# Patient Record
Sex: Male | Born: 1935 | Race: Black or African American | Hispanic: No | Smoking: Never smoker
Health system: Southern US, Community
[De-identification: ages and names within clinical notes are randomized; demographics above are authoritative.]

## PROBLEM LIST (undated history)

## (undated) DIAGNOSIS — M1990 Unspecified osteoarthritis, unspecified site: Secondary | ICD-10-CM

## (undated) HISTORY — PX: EYE SURGERY: SHX253

## (undated) HISTORY — PX: BRAIN SURGERY: SHX531

---

## 1998-10-15 ENCOUNTER — Emergency Department (HOSPITAL_COMMUNITY): Admission: EM | Admit: 1998-10-15 | Discharge: 1998-10-15 | Payer: Self-pay | Admitting: *Deleted

## 1998-10-15 ENCOUNTER — Encounter: Payer: Self-pay | Admitting: Emergency Medicine

## 1998-11-10 ENCOUNTER — Emergency Department (HOSPITAL_COMMUNITY): Admission: EM | Admit: 1998-11-10 | Discharge: 1998-11-10 | Payer: Self-pay | Admitting: Internal Medicine

## 1999-03-02 ENCOUNTER — Ambulatory Visit (HOSPITAL_BASED_OUTPATIENT_CLINIC_OR_DEPARTMENT_OTHER): Admission: RE | Admit: 1999-03-02 | Discharge: 1999-03-02 | Payer: Self-pay | Admitting: Urology

## 1999-12-25 ENCOUNTER — Encounter (INDEPENDENT_AMBULATORY_CARE_PROVIDER_SITE_OTHER): Payer: Self-pay | Admitting: *Deleted

## 1999-12-25 ENCOUNTER — Ambulatory Visit (HOSPITAL_COMMUNITY): Admission: RE | Admit: 1999-12-25 | Discharge: 1999-12-25 | Payer: Self-pay | Admitting: Gastroenterology

## 2000-03-28 ENCOUNTER — Other Ambulatory Visit: Admission: RE | Admit: 2000-03-28 | Discharge: 2000-03-28 | Payer: Self-pay | Admitting: Orthopedic Surgery

## 2002-11-01 ENCOUNTER — Emergency Department (HOSPITAL_COMMUNITY): Admission: EM | Admit: 2002-11-01 | Discharge: 2002-11-01 | Payer: Self-pay | Admitting: Emergency Medicine

## 2002-11-01 ENCOUNTER — Encounter: Payer: Self-pay | Admitting: Emergency Medicine

## 2003-09-22 ENCOUNTER — Encounter: Admission: RE | Admit: 2003-09-22 | Discharge: 2003-10-21 | Payer: Self-pay | Admitting: Family Medicine

## 2003-10-29 ENCOUNTER — Encounter: Admission: RE | Admit: 2003-10-29 | Discharge: 2003-11-19 | Payer: Self-pay | Admitting: Family Medicine

## 2004-01-31 ENCOUNTER — Emergency Department (HOSPITAL_COMMUNITY): Admission: EM | Admit: 2004-01-31 | Discharge: 2004-01-31 | Payer: Self-pay | Admitting: Emergency Medicine

## 2004-11-21 ENCOUNTER — Emergency Department (HOSPITAL_COMMUNITY): Admission: EM | Admit: 2004-11-21 | Discharge: 2004-11-21 | Payer: Self-pay | Admitting: Emergency Medicine

## 2005-05-02 ENCOUNTER — Encounter (INDEPENDENT_AMBULATORY_CARE_PROVIDER_SITE_OTHER): Payer: Self-pay | Admitting: Specialist

## 2005-05-02 ENCOUNTER — Inpatient Hospital Stay (HOSPITAL_COMMUNITY): Admission: RE | Admit: 2005-05-02 | Discharge: 2005-05-05 | Payer: Self-pay | Admitting: Urology

## 2008-01-12 ENCOUNTER — Emergency Department (HOSPITAL_COMMUNITY): Admission: EM | Admit: 2008-01-12 | Discharge: 2008-01-12 | Payer: Self-pay | Admitting: Emergency Medicine

## 2009-02-04 ENCOUNTER — Encounter: Admission: RE | Admit: 2009-02-04 | Discharge: 2009-04-12 | Payer: Self-pay | Admitting: Family Medicine

## 2009-07-04 ENCOUNTER — Encounter: Admission: RE | Admit: 2009-07-04 | Discharge: 2009-07-04 | Payer: Self-pay | Admitting: Family Medicine

## 2009-07-14 ENCOUNTER — Encounter: Admission: RE | Admit: 2009-07-14 | Discharge: 2009-07-14 | Payer: Self-pay | Admitting: Neurosurgery

## 2010-10-13 NOTE — Op Note (Signed)
Leonard Wilcox, Leonard Wilcox             ACCOUNT NO.:  [id number]   MEDICAL RECORD NO.:  [id number]          PATIENT TYPE:  INP   LOCATION:  1432                         FACILITY:  [facility]   PHYSICIAN:  Lindaann Slough, M.D.  DATE OF BIRTH:  June 29, 1930   DATE OF PROCEDURE:  05/03/2005  DATE OF DISCHARGE:                                 OPERATIVE REPORT   PREOPERATIVE DIAGNOSIS:  Benign prostatic hypertrophy.   POSTOPERATIVE DIAGNOSIS:  Benign prostatic hypertrophy.   PROCEDURE:  Simple retropubic prostatectomy.   SURGEON:  Dr. Brunilda Payor   ASSISTANT:  Dr. Blanch Media   ANESTHESIA:  General endotracheal.   SPECIMENS:  Prostatic adenoma.   DRAINS:  10-French fully-fluted Jackson-Pratt drain in the space of Retzius.   PROCEDURE:  The patient was identified by his wrist bracelet and brought to  room 6, where he received preprocedure antibiotics and was administered  general anesthesia.  He was prepped and draped in the usual sterile fashion,  taking great care to minimize risk of peripheral neuropathy and compartment  syndrome.  Next, we placed a 20-French irrigating catheter into his urinary  bladder.  Excellent clear urine output was obtained.  Next, we made a 10 cm  lower midline incision from his pubic symphysis to several centimeters below  his umbilicus.  We carried down through the dermis, subcutaneous fat, and  Scarpa's fascia to the level of the anterior fascia with Bovie cautery and  maintained excellent hemostasis as we progressed.  The fascial sheath was  then opened with Bovie cautery along its length.  We were able to identify  and bluntly separate the medial aspect of the rectus from Morison  pyramidalis muscle bellies.  We then bluntly developed the space of Retzius.  Next, a Bookwalter retractor was set, exposing the bladder and the anterior  surface of the prostate.  With Bovie cautery, we dissected some overlying  fat off the anterior surface of the prostate.  We then placed  stay sutures  on the anterior surface of the prostate at the base and towards the apex  along the midline of the prostate.  We then made a transverse incision with  the Bovie cautery along the anterior surface of the prostate down through  the prostatic capsule.  Next, using the surgeon's finger, we enucleated the  prostatic adenoma in a circumferential manner from the base to the apex of  the prostate gland.  When the entire adenoma was freed, we split it  posteriorly and removed it from the prostatic capsule.  We then inspected  the prostatic fossa, and there were 2 small remaining areas of residual  adenoma which were removed sharply.  Next, we addressed hemostasis.  The  bladder neck was thoroughly inspected.  There was 1 small area of bleeding  at the 5 o'clock position which was addressed with Bovie cautery.  There was  no other bleeding noted in the prostatic fossa. Next, we irrigated the  bladder through the prostatic fossa with Toomey syringe.  Several clots were  removed.  Following this, we proceeded to close the prostatic capsule,  taking full-thickness bites with a  running 0 Vicryl.  Following this, we  made a 0.25 cm incision in the right lower quadrant and placed a tonsil  through the fascia into our operative bed.  A 10-French fully-fluted Blake  drain was brought through on a pass, __________ position.  We had checked  that it would be able to be easily removed through our incision site which  it was.  We next secured it into place with a 4-0 silk suture and placed the  end of the drain over the prostatic suture line.  Following this, we  proceeded to close our fascia with a running 0 PDS.  Wound was irrigated.  We then closed the skin with staples.  Dressings were applied.  Foley  catheter was attached to drainage bag, and the patient was reversed from his  anesthesia without complication.  Please note Dr. Su Grand was present and  participated in all aspects of this  case.     ______________________________  Glade Nurse, MD      Lindaann Slough, M.D.  Electronically Signed    MT/MEDQ  D:  05/03/2005  T:  05/03/2005  Job:  [id number]

## 2010-10-13 NOTE — Procedures (Signed)
[place]  Patient:    Leonard Wilcox, Leonard Wilcox                    MRN: [id number] Proc. Date: 12/25/99 Adm. Date:  [id number] Attending:  Rich Brave CC:         Charles A. Tenny Craw, M.D., [facility]                           Procedure Report  PROCEDURE:  Colonoscopy with biopsies.  INDICATIONS:  A 77 year old with previous history of adenomatous polyp, with most recent colonoscopy five years ago having been negative.  FINDINGS:  Diminutive sessile polyp, biopsied.  Otherwise normal.  PROCEDURE:  The nature, purpose, and risks of the procedure were familiar to the patient from prior examinations and he provided written consent.  Sedation was fentanyl 50 mcg and Versed 5 mg IV without arrhythmias or desaturation. Digital examination:  the prostate was unremarkable.  We used the Olympus pediatric adjustable-tension video colonoscope, which was advanced quite easily to the cecum, applying some external abdominal compression to reach the base of the cecum.  The cecum was identified by visualization of the appendiceal orifice and the absence of further lumen.  Pullback was then performed.  The quality of the prep was excellent and it was felt that all areas were adequately seen.  There was a 3 mm sessile or semipedunculated polyp in the mid colon removed by a single cold biopsy.  There was also what appeared to be a transiently-seen diminutive sessile polyp in the proximal ascending colon, but careful examination of the area failed to show this to be a persistent lesion.  No large polyps, cancer, colitis, vascular malformations or diverticular disease were observed and retroflexion in the rectum was unremarkable.  The patient tolerated the procedure well and there were no apparent complications.  IMPRESSION:  Normal examination except for diminutive polyp in mid colon, pathology pending.  PLAN:  Await pathology.  I  anticipate doing colonoscopic surveillance in five years, regardless of the polyp histology, in view of the prior history of a colonic adenoma having been removed. DD:  12/25/98 TD:  12/25/99 Job: [id number]

## 2010-10-13 NOTE — Discharge Summary (Signed)
Leonard Wilcox, Leonard Wilcox             ACCOUNT NO.:  [id number]   MEDICAL RECORD NO.:  [id number]          PATIENT TYPE:  INP   LOCATION:  1432                         FACILITY:  [facility]   PHYSICIAN:  Lindaann Slough, M.D.  DATE OF BIRTH:  1930-12-31   DATE OF ADMISSION:  05/02/2005  DATE OF DISCHARGE:  05/05/2005                                 DISCHARGE SUMMARY   DISCHARGE DIAGNOSES:  Benign prostatic hypertrophy.   PROCEDURE:  Simple retropubic prostatectomy.   SURGEON:  Danae Chen, M.D.   CONSULTS:  None.   HOSPITAL COURSE:  Patient was admitted to the operating room on May 02, 2005 and underwent the above-named procedure, which he has tolerated without  complication.  He was taken to the PACU and to the floor in stable  condition, where he remained throughout his hospital stay, which consisted  of progressive toleration of ambulation, diet, and pain.   On postoperative day #2, his Jackson-Pratt output had slowed to less than 10  cc a shift, and his drain was removed without difficulty.  He was then  discharged, and the patient was in stable condition to be discharged home.   DISCHARGE PHYSICAL EXAMINATION:  ABDOMEN:  Soft, nondistended, nontender to  palpation.  Staple line is clean, dry, and intact without surrounding  erythema or exudate.  Foley catheter is in place draining clear urine.   DISCHARGE INSTRUCTIONS:  Patient is given routine wound care instructions.  Directed to call or return if he begins to experience any fevers, chills,  redness, or drainage from his wound.  He is instructed not to lift more than  10 pounds over the next six weeks.  He is given routine Foley care  instructions.  He is to call or return if he begins to experience leakage of  urine around his catheter or decreased output from his catheter.  He  understands and agrees with these instructions.  He will be scheduled to  follow up with Dr. Brunilda Payor in approximately one week for wound check as well  as  for removal of Foley catheter.   DISCHARGE MEDICATIONS:  1.  Vicodin.  2.  Colace.  3.  Ditropan 5 mg q.8h. p.r.n.  4.  Resume previous meds.     ______________________________  Glade Nurse, MD      Lindaann Slough, M.D.  Electronically Signed    MT/MEDQ  D:  05/07/2005  T:  05/07/2005  Job:  [id number]

## 2010-10-13 NOTE — H&P (Signed)
Leonard Wilcox, Leonard Wilcox             ACCOUNT NO.:  [id number]   MEDICAL RECORD NO.:  [id number]          PATIENT TYPE:  INP   LOCATION:  0007                         FACILITY:  [facility]   PHYSICIAN:  Lindaann Slough, M.D.  DATE OF BIRTH:  10/07/1930   DATE OF ADMISSION:  05/02/2005  DATE OF DISCHARGE:                                HISTORY & PHYSICAL   CHIEF COMPLAINT:  1.  Frequency, hesitancy, and decreased force of urinary stream.  2.  Incomplete emptying of the bladder.   HISTORY OF PRESENT ILLNESS:  The patient is a 77 year old male who has a  long history of frequency, hesitancy, decreased force of urinary stream, and  a sensation of incomplete emptying of the bladder.  He has been on Flomax  two capsules at night.  He continues to have the same symptoms.  He has a  history of an elevated PSA and had a negative prostate biopsy in August  2006.  A prostate ultrasound shows a volume of 149 g.  Treatment options  were discussed with the patient.  I told him that a TURP could require more  than one session and it would be preferable to do an open prostatectomy.  The procedure, the risks, and the benefits have been discussed with the  patient and his wife.  He is admitted today for the procedure.   PAST MEDICAL HISTORY:  Positive for hypertension.   MEDICATIONS:  1.  Flomax two capsules at night.  2.  Altace 10 mg daily.  3.  Hydrochlorothiazide 12.5 mg a day.   ALLERGIES:  SPORANOX.   PAST SURGICAL HISTORY:  None.   SOCIAL HISTORY:  He is married and has one son.  He does not smoke and  drinks one glass of wine every night.   FAMILY HISTORY:  Positive for hypertension.  He has two brothers.  His  father died in his 71s and his mother died in her 60s.   REVIEW OF SYSTEMS:  He complains of fever and chills at times.  Frequency,  slow stream, and voiding a small amount of urine at a time.  He has  straining on urination, nocturia times one to two.  All others are negative.   PHYSICAL EXAMINATION:  GENERAL:  This is a well-built 77 year old male in no  acute distress.  He is oriented to time, place, and person.  VITAL SIGNS:  Blood pressure is 138/88, pulse 62, respirations 14,  temperature 97.  HEENT:  His head is normal.  Pupils are equal and reactive to light and  accommodation.  Ears, nose, and throat within normal limits.  He has pink  conjunctivae.  NECK:  He has no cervical adenopathy.  Supple.  No thyromegaly.  LUNGS:  Clear to percussion and auscultation.  HEART:  Regular rhythm.  ABDOMEN:  Soft, non-distended, non-tender.  He has no CVA tenderness.  The  kidneys are not palpable.  He has no hepatomegaly or  splenomegaly.  The  bladder is not distended.  He has no umbilical or inguinal hernia.  No  inguinal adenopathy.  GENITALIA:  The penis is circumcised and  the meatus is normal.  Scrotum is  normal.  The tail of the right epididymis is firm and non-tender.  The left  testes is normal.  There is no testicular mass.  The left epididymis is  normal.  Both cords are normal.  Anal and perineal skin is normal.  RECTAL:  Sphincter tone is normal.  The prostate is enlarged: 90 g.  no  nodules.  The seminal vesicles are not palpable.   IMPRESSION:  1.  Bladder outlet obstruction.  2.  Benign prostatic hypertrophy.  3.  Hypertension.      Lindaann Slough, M.D.  Electronically Signed     MN/MEDQ  D:  05/02/2005  T:  05/02/2005  Job:  [id number]

## 2011-06-21 DIAGNOSIS — IMO0002 Reserved for concepts with insufficient information to code with codable children: Secondary | ICD-10-CM | POA: Insufficient documentation

## 2011-06-21 DIAGNOSIS — H35372 Puckering of macula, left eye: Secondary | ICD-10-CM | POA: Insufficient documentation

## 2011-06-21 DIAGNOSIS — H33022 Retinal detachment with multiple breaks, left eye: Secondary | ICD-10-CM | POA: Insufficient documentation

## 2011-06-21 DIAGNOSIS — H33311 Horseshoe tear of retina without detachment, right eye: Secondary | ICD-10-CM | POA: Insufficient documentation

## 2011-06-21 DIAGNOSIS — Z961 Presence of intraocular lens: Secondary | ICD-10-CM | POA: Insufficient documentation

## 2013-02-16 ENCOUNTER — Other Ambulatory Visit: Payer: Self-pay | Admitting: Gastroenterology

## 2013-06-25 DIAGNOSIS — Z961 Presence of intraocular lens: Secondary | ICD-10-CM | POA: Insufficient documentation

## 2015-03-28 ENCOUNTER — Encounter: Payer: Self-pay | Admitting: Podiatry

## 2015-03-28 ENCOUNTER — Ambulatory Visit (INDEPENDENT_AMBULATORY_CARE_PROVIDER_SITE_OTHER): Payer: Medicare Other | Admitting: Podiatry

## 2015-03-28 VITALS — BP 140/97 | HR 75 | Resp 16 | Ht 74.0 in | Wt 156.0 lb

## 2015-03-28 DIAGNOSIS — L600 Ingrowing nail: Secondary | ICD-10-CM

## 2015-03-28 NOTE — Patient Instructions (Signed)

## 2015-03-28 NOTE — Progress Notes (Signed)
   Subjective:    Patient ID: Leonard Wilcox, male    DOB: Sep 29, 1930, 81 y.o.   MRN: [id number]  HPI Patient presents with a nail problem in their right foot, great toe-medial side. Pt stated, "nail was removed around 2011 and now nail is growing back".  Pt saw Dr. Salley Scarlet regarding toe. Dr. Rockey Situ pt looked infected because pus was coming out.   Review of Systems  HENT: Positive for hearing loss.   Eyes: Positive for visual disturbance.  Skin: Positive for color change.  All other systems reviewed and are negative.      Objective:   Physical Exam        Assessment & Plan:

## 2015-03-29 ENCOUNTER — Telehealth: Payer: Self-pay | Admitting: *Deleted

## 2015-03-29 NOTE — Telephone Encounter (Signed)
Left message for patient at [phone] (Home #) to check to see how they were doing from their ingrown toenail procedure that was performed on Monday, March 28, 2015. Waiting for a response.

## 2015-03-30 ENCOUNTER — Telehealth: Payer: Self-pay | Admitting: *Deleted

## 2015-03-30 NOTE — Telephone Encounter (Signed)
Pt states he had an ingrown toenail procedure 03/28/2015 and misread the soaking instructions and soaked the foot the day of the surgery rather than the next day.  Pt asked if that was okay.  Left message informing pt that there should be no decrease in the effectiveness of the medication applied to the surgical site.

## 2015-03-30 NOTE — Progress Notes (Signed)
Subjective:     Patient ID: DENSON NICCOLI, male   DOB: 1930-11-12, 81 y.o.   MRN: [id number]  HPI patient presents with a nail spicule right big toe that can become bothersome and makes it hard to take off his socks. He had had previous surgery done several years ago   Review of Systems  All other systems reviewed and are negative.      Objective:   Physical Exam  Constitutional: He is oriented to person, place, and time.  Cardiovascular: Intact distal pulses.   Musculoskeletal: Normal range of motion.  Neurological: He is oriented to person, place, and time.  Skin: Skin is warm.  Nursing note and vitals reviewed.  neurovascular status found to be intact muscle strength adequate range of motion within normal limits with patient noted to have a ingrown toenail right hallux medial side that's painful when pressed and can make shoe gear difficult. Patient's tried to trim it himself without relief of symptoms and is found to have good digital perfusion and is well oriented 3     Assessment:      ingrown toenail deformity right hallux medial border    Plan:      H&P condition reviewed with patient. I have recommended removal of the nail corner and explained risk and he wants surgery and today I infiltrated 60 mg Xylocaine Marcaine mixture and remove the corner and applied phenol 3 applications 30 seconds followed by alcohol lavage and sterile dressing. They've instructions on soaks and reappoint

## 2015-03-30 NOTE — Telephone Encounter (Signed)
Pt states he received my first call answering his soaking question, but would like to know how long he needed to keep the bandaid on the toe.  Left message informing pt he would need to keep the area covered with an antibiotic bandaid after each soak, and the soaking would need to continue for 2-3 weeks for a spicule removal, that once the area had a hard dry scab without drainage, redness or swelling he could stop the soaks and dressings.

## 2015-04-04 ENCOUNTER — Other Ambulatory Visit: Payer: Self-pay | Admitting: Family Medicine

## 2015-04-04 ENCOUNTER — Ambulatory Visit
Admission: RE | Admit: 2015-04-04 | Discharge: 2015-04-04 | Disposition: A | Discharge status: Home / Self Care | Payer: Medicare Other | Source: Ambulatory Visit | Attending: Family Medicine | Admitting: Family Medicine

## 2015-04-04 ENCOUNTER — Ambulatory Visit: Payer: Self-pay | Admitting: Podiatry

## 2015-04-04 DIAGNOSIS — R079 Chest pain, unspecified: Secondary | ICD-10-CM

## 2015-04-13 ENCOUNTER — Telehealth: Payer: Self-pay | Admitting: *Deleted

## 2015-04-13 NOTE — Telephone Encounter (Signed)
Pt states he is now soaking the toe that had the spicule procedure once daily and covering with Neosporin, is that okay.  Left message encouraging pt to continue daily soaks and neosporin dressings if he will be up and around or in enclosed shoes, but if resting or going to bed may allow the area to air dry, this will help him get to the point where the area has a hard dry scab, without drainage, and then he can stop soaks and dressings.

## 2015-05-05 ENCOUNTER — Encounter: Payer: Self-pay | Admitting: Podiatry

## 2015-05-05 ENCOUNTER — Ambulatory Visit (INDEPENDENT_AMBULATORY_CARE_PROVIDER_SITE_OTHER): Payer: Medicare Other | Admitting: Podiatry

## 2015-05-05 VITALS — BP 121/80 | HR 74 | Resp 16

## 2015-05-05 DIAGNOSIS — L600 Ingrowing nail: Secondary | ICD-10-CM

## 2015-05-08 NOTE — Progress Notes (Signed)
Subjective:     Patient ID: Leonard Wilcox, male   DOB: 1931/01/22, 81 y.o.   MRN: [id number]  HPI patient points to right great toe stating that he just wanted to make sure it was okay   Review of Systems     Objective:   Physical Exam Neurovascular status intact with minimal drainage noted right hallux with good healing of the site    Assessment:     Reviewed the continuation of conservative care    Plan:     Patient may gradually reduce any bandage usage and return soft shoe gear

## 2015-07-21 DIAGNOSIS — H353131 Nonexudative age-related macular degeneration, bilateral, early dry stage: Secondary | ICD-10-CM | POA: Insufficient documentation

## 2016-01-20 ENCOUNTER — Emergency Department (HOSPITAL_COMMUNITY)
Admission: EM | Admit: 2016-01-20 | Discharge: 2016-01-20 | Disposition: A | Discharge status: Home / Self Care | Payer: Medicare Other | Attending: Emergency Medicine | Admitting: Emergency Medicine

## 2016-01-20 ENCOUNTER — Emergency Department (HOSPITAL_COMMUNITY): Payer: Medicare Other

## 2016-01-20 ENCOUNTER — Encounter (HOSPITAL_COMMUNITY): Payer: Self-pay | Admitting: Emergency Medicine

## 2016-01-20 DIAGNOSIS — I10 Essential (primary) hypertension: Secondary | ICD-10-CM | POA: Diagnosis not present

## 2016-01-20 DIAGNOSIS — I471 Supraventricular tachycardia: Secondary | ICD-10-CM | POA: Insufficient documentation

## 2016-01-20 DIAGNOSIS — R000 Tachycardia, unspecified: Secondary | ICD-10-CM | POA: Diagnosis present

## 2016-01-20 HISTORY — DX: Unspecified osteoarthritis, unspecified site: M19.90

## 2016-01-20 LAB — CBC WITH DIFFERENTIAL/PLATELET
BASOS ABS: 0 10*3/uL (ref 0.0–0.1)
Basophils Relative: 0 %
EOS ABS: 0 10*3/uL (ref 0.0–0.7)
EOS PCT: 1 %
HCT: 41.3 % (ref 39.0–52.0)
Hemoglobin: 13.4 g/dL (ref 13.0–17.0)
LYMPHS ABS: 1.7 10*3/uL (ref 0.7–4.0)
LYMPHS PCT: 31 %
MCH: 27.8 pg (ref 26.0–34.0)
MCHC: 32.4 g/dL (ref 30.0–36.0)
MCV: 85.7 fL (ref 78.0–100.0)
MONO ABS: 0.4 10*3/uL (ref 0.1–1.0)
Monocytes Relative: 7 %
Neutro Abs: 3.3 10*3/uL (ref 1.7–7.7)
Neutrophils Relative %: 61 %
PLATELETS: 191 10*3/uL (ref 150–400)
RBC: 4.82 MIL/uL (ref 4.22–5.81)
RDW: 16.6 % — AB (ref 11.5–15.5)
WBC: 5.4 10*3/uL (ref 4.0–10.5)

## 2016-01-20 LAB — PHOSPHORUS: PHOSPHORUS: 3.1 mg/dL (ref 2.5–4.6)

## 2016-01-20 LAB — I-STAT TROPONIN, ED: TROPONIN I, POC: 0 ng/mL (ref 0.00–0.08)

## 2016-01-20 LAB — BASIC METABOLIC PANEL
Anion gap: 7 (ref 5–15)
BUN: 18 mg/dL (ref 6–20)
CALCIUM: 9.7 mg/dL (ref 8.9–10.3)
CO2: 29 mmol/L (ref 22–32)
Chloride: 103 mmol/L (ref 101–111)
Creatinine, Ser: 0.81 mg/dL (ref 0.61–1.24)
GFR calc Af Amer: >60 mL/min (ref >60)
GLUCOSE: 108 mg/dL — AB (ref 65–99)
Potassium: 3.5 mmol/L (ref 3.5–5.1)
SODIUM: 139 mmol/L (ref 135–145)

## 2016-01-20 LAB — D-DIMER, QUANTITATIVE: D-Dimer, Quant: 0.44 ug/mL-FEU (ref 0.00–0.50)

## 2016-01-20 LAB — MAGNESIUM: MAGNESIUM: 2 mg/dL (ref 1.7–2.4)

## 2016-01-20 MED ORDER — SODIUM CHLORIDE 0.9 % IV BOLUS (SEPSIS)
1000.0000 mL | Freq: Once | INTRAVENOUS | Status: AC
  Administered 2016-01-20: 1000 mL via INTRAVENOUS

## 2016-01-20 MED ORDER — METOPROLOL SUCCINATE ER 25 MG PO TB24: 25.0000 mg | ORAL_TABLET | Freq: Every day | ORAL | 0 refills | Status: DC

## 2016-01-20 MED ORDER — ASPIRIN 81 MG PO CHEW
324.0000 mg | CHEWABLE_TABLET | Freq: Once | ORAL | Status: AC
  Administered 2016-01-20: 324 mg via ORAL
  Filled 2016-01-20: qty 4

## 2016-01-20 MED ORDER — ADENOSINE 6 MG/2ML IV SOLN
INTRAVENOUS | Status: AC
  Administered 2016-01-20: 6 mg
  Filled 2016-01-20: qty 4

## 2016-01-20 NOTE — ED Notes (Signed)
zoll pads on pt. Family at bedside.

## 2016-01-20 NOTE — ED Provider Notes (Signed)
[facility] DEPT Provider Note   CSN: [id number] Arrival date & time: 01/20/16  1825   History   Chief Complaint Chief Complaint  Patient presents with  . Tachycardia    HPI Leonard Wilcox is a 82 y.o. male.  The history is provided by the patient and the EMS personnel.   82 year old male with history of hypertension, arthritis presenting with palpitations. Onset about one hour prior to arrival. Context-patient states he was not being particularly active, was with his family. Constant since onset. Worse with any exertion. Described as feeling heart racing and pounding in chest. Not alleviated by vagal maneuvers attempted with EMS. Denies associated chest pain, shortness of breath, diaphoresis, nausea or vomiting. No similar prior episodes.     Past Medical History:  Diagnosis Date  . Arthritis     There are no active problems to display for this patient.   History reviewed. No pertinent surgical history.   Home Medications    Prior to Admission medications   Medication Sig Start Date End Date Taking? Authorizing Provider  hydrochlorothiazide (HYDRODIURIL) 25 MG tablet Take 25 mg by mouth daily.  02/26/15   Historical Provider, MD  metoprolol succinate (TOPROL-XL) 25 MG 24 hr tablet Take 1 tablet (25 mg total) by mouth daily. 01/20/16   Ivin Booty, MD  ramipril (ALTACE) 10 MG capsule Take 10 mg by mouth daily.  12/26/14   Historical Provider, MD    Family History No family history on file.  Social History Social History  Substance Use Topics  . Smoking status: Never Smoker  . Smokeless tobacco: Never Used  . Alcohol use No     Allergies   Sporanox [itraconazole]   Review of Systems Review of Systems  Constitutional: Negative for diaphoresis and fever.  Eyes: Negative for visual disturbance.  Respiratory: Negative for cough and shortness of breath.   Cardiovascular: Positive for palpitations. Negative for chest pain and leg swelling.    Gastrointestinal: Negative for abdominal pain, nausea and vomiting.  Genitourinary: Negative for decreased urine volume.  Musculoskeletal: Negative for back pain.  Skin: Negative for rash.  Allergic/Immunologic: Negative for immunocompromised state.  Neurological: Negative for syncope, speech difficulty, weakness, light-headedness and numbness.  Hematological: Does not bruise/bleed easily.  Psychiatric/Behavioral: Negative for confusion.  All other systems reviewed and are negative.   Physical Exam Updated Vital Signs BP 128/92   Pulse 76   Temp 98 F (36.7 C) (Oral)   Resp 15   Ht 6\' 2"  (1.88 m)   Wt 65.8 kg   SpO2 100%   BMI 18.62 kg/m   Physical Exam  Constitutional: He is oriented to person, place, and time. He appears well-developed and well-nourished.  HENT:  Head: Normocephalic and atraumatic.  Eyes: Conjunctivae are normal.  Neck: Neck supple. No JVD present.  Cardiovascular: Regular rhythm and intact distal pulses.  Tachycardia present.   No murmur heard. Pulmonary/Chest: Effort normal and breath sounds normal. No respiratory distress.  Abdominal: Soft. There is no tenderness.  Musculoskeletal: He exhibits no edema.  Neurological: He is alert and oriented to person, place, and time.  Skin: Skin is warm and dry. He is not diaphoretic.  Psychiatric: He has a normal mood and affect.  Nursing note and vitals reviewed.   ED Treatments / Results  Labs (all labs ordered are listed, but only abnormal results are displayed) Labs Reviewed  CBC WITH DIFFERENTIAL/PLATELET - Abnormal; Notable for the following:       Result Value   RDW  16.6 (*)    All other components within normal limits  BASIC METABOLIC PANEL - Abnormal; Notable for the following:    Glucose, Bld 108 (*)    All other components within normal limits  MAGNESIUM  PHOSPHORUS  D-DIMER, QUANTITATIVE (NOT AT [facility])  I-STAT TROPOININ, ED    EKG  EKG Interpretation  Date/Time:  Friday January 20 2016  19:03:00 EDT Ventricular Rate:  110 PR Interval:    QRS Duration: 81 QT Interval:  345 QTC Calculation: 467 R Axis:   61 Text Interpretation:  Sinus tachycardia Consider left ventricular hypertrophy Since prior ECG, SVT has resolved and sinus tachycardia is now present Reconfirmed by [facility] MD, [place] ([id number]) on 01/21/2016 2:48:47 PM       Radiology Dg Chest Portable 1 View  Result Date: 01/20/2016 CLINICAL DATA:  New tachycardia. EXAM: PORTABLE CHEST 1 VIEW COMPARISON:  04/04/2015 FINDINGS: The heart size and mediastinal contours are within normal limits. Both lungs are clear. The visualized skeletal structures are unremarkable. IMPRESSION: No active disease. Electronically Signed   By: Kathreen Devoid   On: 01/20/2016 19:34    Procedures Procedures (including critical care time)  Medications Ordered in ED Medications  adenosine (ADENOCARD) 6 MG/2ML injection (6 mg  Given 01/20/16 1905)  sodium chloride 0.9 % bolus 1,000 mL (0 mLs Intravenous Stopped 01/20/16 2029)  aspirin chewable tablet 324 mg (324 mg Oral Given 01/20/16 1918)     Initial Impression / Assessment and Plan / ED Course  I have reviewed the triage vital signs and the nursing notes.  Pertinent labs & imaging results that were available during my care of the patient were reviewed by me and considered in my medical decision making (see chart for details).  Clinical Course    82 year old male with history of hypertension, arthritis presenting with palpitations. Afebrile, tachycardic to 160s upon arrival. Otherwise normal to higher range blood pressures and stable oxygen saturations on room air. Not diaphoretic and well-appearing.  EKG notable for SVT. Given adenosine with successful conversion to normal sinus rhythm after one dose. On repeat EKG, no acute ischemic changes. No acute cardiopulmonary disease on chest x-ray. Troponin negative. D-dimer negative. Doubt ACS or PE. Electrolytes within normal limits.   Patient  would like to go home. Will start small dose of metoprolol and have patient follow up closely with Cardiology for new onset SVT. Strict return precautions discussed. Dc in stable condition.   Case discussed with Dr. Billy Fischer, who oversaw management of this patient.   Final Clinical Impressions(s) / ED Diagnoses   Final diagnoses:  SVT (supraventricular tachycardia) ([place])    New Prescriptions Discharge Medication List as of 01/20/2016  9:40 PM    START taking these medications   Details  metoprolol succinate (TOPROL-XL) 25 MG 24 hr tablet Take 1 tablet (25 mg total) by mouth daily., Starting Fri 01/20/2016, Print         Ivin Booty, MD 01/21/16 [id number]    Gareth Morgan, MD 01/22/16 [id number]

## 2016-01-20 NOTE — ED Triage Notes (Signed)
Pt was having A BM APPROX. 1 hr ago and felt like his heart was racing. EMS reports HR 170's. On arrival to ed hr 170's-180's.

## 2016-02-14 ENCOUNTER — Encounter: Payer: Self-pay | Admitting: Internal Medicine

## 2016-02-27 ENCOUNTER — Encounter: Payer: Self-pay | Admitting: Internal Medicine

## 2016-02-27 ENCOUNTER — Ambulatory Visit (INDEPENDENT_AMBULATORY_CARE_PROVIDER_SITE_OTHER): Payer: Medicare Other | Admitting: Internal Medicine

## 2016-02-27 VITALS — BP 142/86 | HR 74 | Ht 74.0 in | Wt 152.8 lb

## 2016-02-27 DIAGNOSIS — I471 Supraventricular tachycardia: Secondary | ICD-10-CM

## 2016-02-27 NOTE — Patient Instructions (Signed)
Your physician recommends that you continue on your current medications as directed. Please refer to the Current Medication list given to you today.  Your physician has recommended that you wear an event monitor. Event monitors are medical devices that record the heart's electrical activity. Doctors most often [place] these monitors to diagnose arrhythmias. Arrhythmias are problems with the speed or rhythm of the heartbeat. The monitor is a small, portable device. You can wear one while you do your normal daily activities. This is usually used to diagnose what is causing palpitations/syncope (passing out).  Your physician wants you to follow-up in: May, 2018 with Dr. Harrington Challenger.  You will receive a reminder letter in the mail two months in advance. If you don't receive a letter, please call our office to schedule the follow-up appointment.

## 2016-02-27 NOTE — Progress Notes (Signed)
Cardiology Office Note   Date:  02/27/2016   ID:  Leonard Wilcox, DOB 02/14/1931, MRN [id number]  PCP:   Melinda Crutch, MD  Cardiologist:   Dorris Carnes, MD   Referred for eval of SVT     History of Present Illness: Leonard Wilcox is a 82 y.o. male referred for episode of SVT  Episode occurred earliet this fall  Patient says one night het went to [place] to urinate  Felt/heard pounding in his neck/chest  Heart racing  Went to take BP  BP was 168/100  HR 169   Called EMS   EMS recomm he try vagal then treated with adenosine  No dizziness No passed out Toprol Rx'd by ER   Goes to silver sneakers  Woks in yard.   NO CP  No SOB Does note an occasional buzzing sensation in chest  Short lived  Not associated with dizziness  Outpatient Medications Prior to Visit  Medication Sig Dispense Refill  . hydrochlorothiazide (HYDRODIURIL) 25 MG tablet Take 25 mg by mouth daily.     . metoprolol succinate (TOPROL-XL) 25 MG 24 hr tablet Take 1 tablet (25 mg total) by mouth daily. 30 tablet 0  . ramipril (ALTACE) 10 MG capsule Take 10 mg by mouth daily.      No facility-administered medications prior to visit.      Allergies:   Sulfa antibiotics; Other; and Sporanox [itraconazole]   Past Medical History:  Diagnosis Date  . Arthritis     History reviewed. No pertinent surgical history.   Social History:  The patient  reports that he has never smoked. He has never used smokeless tobacco. He reports that he does not drink alcohol or use drugs.   Family History:  The patient's family history includes Dementia in his mother; Stomach cancer in his father.    ROS:  Please see the history of present illness. All other systems are reviewed and  Negative to the above problem except as noted.    PHYSICAL EXAM: VS:  BP (!) 142/86   Pulse 74   Ht 6\' 2"  (1.88 m)   Wt 152 lb 12.8 oz (69.3 kg)   SpO2 99%   BMI 19.62 kg/m   GEN: Well nourished, well developed, in no acute distress  HEENT:  normal  Neck: no JVD, carotid bruits, or masses Cardiac: RRR; no murmurs, rubs, or gallops,no edema  Respiratory:  clear to auscultation bilaterally, normal work of breathing GI: soft, nontender, nondistended, + BS  No hepatomegaly  MS: no deformity Moving all extremities   Skin: warm and dry, no rash Neuro:  Strength and sensation are intact Psych: euthymic mood, full affect   EKG:  EKG is not  ordered today.   Lipid Panel No results found for: CHOL, TRIG, HDL, CHOLHDL, VLDL, LDLCALC, LDLDIRECT    Wt Readings from Last 3 Encounters:  02/27/16 152 lb 12.8 oz (69.3 kg)  01/20/16 145 lb (65.8 kg)  03/28/15 156 lb (70.8 kg)      ASSESSMENT AND PLAN:  1  SVT  I have reviewed EKG from ER  Looks like AVNRT I have told the pt about the rhythm  Not life threatening  Can cause dizziness / presyncope   I am not convinced toprol is keeping him in SR  Can discontinue b blocker I wouldrecomm an event montior to catch the buzzing in chest Otherwise stay active  No restriction  Follow for recurrence  I will f/u next may  Current medicines are reviewed at length with the patient today.  The patient does not have concerns regarding medicines.    Signed, Dorris Carnes, MD  02/27/2016 12:21 PM    [facility] [place], [place], [place]  [address] Phone: [phone]; Fax: [phone]

## 2016-03-07 ENCOUNTER — Ambulatory Visit (INDEPENDENT_AMBULATORY_CARE_PROVIDER_SITE_OTHER): Payer: Medicare Other

## 2016-03-07 DIAGNOSIS — I471 Supraventricular tachycardia: Secondary | ICD-10-CM

## 2016-07-26 DIAGNOSIS — Z79899 Other long term (current) drug therapy: Secondary | ICD-10-CM | POA: Insufficient documentation

## 2016-07-26 DIAGNOSIS — H25811 Combined forms of age-related cataract, right eye: Secondary | ICD-10-CM | POA: Insufficient documentation

## 2016-08-30 ENCOUNTER — Encounter: Payer: Self-pay | Admitting: Internal Medicine

## 2016-09-10 NOTE — Progress Notes (Signed)
Cardiology Office Note   Date:  09/17/2016   ID:  Leonard RECENDEZ, DOB 29-Jun-1930, MRN [id number]  PCP:  Leonard Crutch, MD  Cardiologist:   Leonard Carnes, MD   Pt returns for f/u of SVT       History of Present Illness: Leonard Wilcox is a 83 y.o. male referred last fall for SVT   Since seen the pt wore an event monitor that showed no arrhythmia  He denies palpitations   No SOB  No CP  Stays active  Walks   Outpatient Medications Prior to Visit  Medication Sig Dispense Refill  . hydrochlorothiazide (HYDRODIURIL) 25 MG tablet Take 25 mg by mouth daily.     . hydroxychloroquine (PLAQUENIL) 200 MG tablet Take 200 mg by mouth 2 (two) times daily.    . predniSONE (DELTASONE) 5 MG tablet Take 3 mg by mouth daily.     . ramipril (ALTACE) 10 MG capsule Take 10 mg by mouth daily.     . metoprolol succinate (TOPROL-XL) 25 MG 24 hr tablet Take 1 tablet (25 mg total) by mouth daily. (Patient not taking: Reported on 09/17/2016) 30 tablet 0   No facility-administered medications prior to visit.      Allergies:   Sulfa antibiotics; Other; and Sporanox [itraconazole]   Past Medical History:  Diagnosis Date  . Arthritis     History reviewed. No pertinent surgical history.   Social History:  The patient  reports that he has never smoked. He has never used smokeless tobacco. He reports that he does not drink alcohol or use drugs.   Family History:  The patient's family history includes Dementia in his mother; Stomach cancer in his father.    ROS:  Please see the history of present illness. All other systems are reviewed and  Negative to the above problem except as noted.    PHYSICAL EXAM: VS:  BP (!) 142/82   Pulse 87   Ht 6\' 2"  (1.88 m)   Wt 158 lb 12.8 oz (72 kg)   SpO2 98%   BMI 20.39 kg/m   GEN: Well nourished, well developed, in no acute distress  HEENT: normal  Neck: no JVD, carotid bruits, or masses Cardiac: RRR; no murmurs, rubs, or gallops,no edema  Respiratory:   clear to auscultation bilaterally, normal work of breathing GI: soft, nontender, nondistended, + BS  No hepatomegaly  MS: no deformity Moving all extremities   Skin: warm and dry, no rash Neuro:  Strength and sensation are intact Psych: euthymic mood, full affect   EKG:  EKG is not  ordered today.   Lipid Panel No results found for: CHOL, TRIG, HDL, CHOLHDL, VLDL, LDLCALC, LDLDIRECT    Wt Readings from Last 3 Encounters:  09/17/16 158 lb 12.8 oz (72 kg)  02/27/16 152 lb 12.8 oz (69.3 kg)  01/20/16 145 lb (65.8 kg)      ASSESSMENT AND PLAN:  1  SVT  No recurrence since seen We discussed vagal maneuvers to try to stop episodes   I would keep on current meds  Will be available as needed if recurs  2  HTN  BP is fair  I would keep on same regimen  WIll be available as needed    Current medicines are reviewed at length with the patient today.  The patient does not have concerns regarding medicines.    Signed, Leonard Carnes, MD  09/17/2016 2:58 PM    [facility]  [address], [place], [place]  [address] Phone: [phone]; Fax: [phone]

## 2016-09-17 ENCOUNTER — Ambulatory Visit (INDEPENDENT_AMBULATORY_CARE_PROVIDER_SITE_OTHER): Payer: Medicare Other | Admitting: Internal Medicine

## 2016-09-17 ENCOUNTER — Encounter: Payer: Self-pay | Admitting: Internal Medicine

## 2016-09-17 VITALS — BP 142/82 | HR 87 | Ht 74.0 in | Wt 158.8 lb

## 2016-09-17 DIAGNOSIS — I10 Essential (primary) hypertension: Secondary | ICD-10-CM

## 2016-09-17 DIAGNOSIS — I471 Supraventricular tachycardia: Secondary | ICD-10-CM | POA: Diagnosis not present

## 2016-09-17 NOTE — Patient Instructions (Signed)
Your physician recommends that you continue on your current medications as directed. Please refer to the Current Medication list given to you today. Your physician recommends that you schedule a follow-up appointment as needed with Dr. Ross.   

## 2017-03-08 ENCOUNTER — Other Ambulatory Visit: Payer: Self-pay | Admitting: Otolaryngology

## 2017-03-08 DIAGNOSIS — H903 Sensorineural hearing loss, bilateral: Secondary | ICD-10-CM

## 2017-03-20 ENCOUNTER — Ambulatory Visit
Admission: RE | Admit: 2017-03-20 | Discharge: 2017-03-20 | Disposition: A | Discharge status: Home / Self Care | Payer: Medicare Other | Source: Ambulatory Visit | Attending: Otolaryngology | Admitting: Otolaryngology

## 2017-03-20 DIAGNOSIS — H903 Sensorineural hearing loss, bilateral: Secondary | ICD-10-CM

## 2017-03-20 MED ORDER — GADOBENATE DIMEGLUMINE 529 MG/ML IV SOLN
15.0000 mL | Freq: Once | INTRAVENOUS | Status: AC | PRN
  Administered 2017-03-20: 15 mL via INTRAVENOUS

## 2017-09-10 ENCOUNTER — Other Ambulatory Visit: Payer: Self-pay | Admitting: Otolaryngology

## 2017-09-10 DIAGNOSIS — H903 Sensorineural hearing loss, bilateral: Secondary | ICD-10-CM

## 2017-09-22 DIAGNOSIS — N400 Enlarged prostate without lower urinary tract symptoms: Secondary | ICD-10-CM | POA: Insufficient documentation

## 2017-09-22 DIAGNOSIS — R300 Dysuria: Secondary | ICD-10-CM | POA: Insufficient documentation

## 2017-09-30 ENCOUNTER — Ambulatory Visit
Admission: RE | Admit: 2017-09-30 | Discharge: 2017-09-30 | Disposition: A | Discharge status: Home / Self Care | Payer: Medicare Other | Source: Ambulatory Visit | Attending: Otolaryngology | Admitting: Otolaryngology

## 2017-09-30 DIAGNOSIS — H903 Sensorineural hearing loss, bilateral: Secondary | ICD-10-CM

## 2017-09-30 MED ORDER — GADOBENATE DIMEGLUMINE 529 MG/ML IV SOLN
14.0000 mL | Freq: Once | INTRAVENOUS | Status: AC | PRN
  Administered 2017-09-30: 14 mL via INTRAVENOUS

## 2017-10-18 ENCOUNTER — Other Ambulatory Visit: Payer: Self-pay | Admitting: Family Medicine

## 2017-10-18 DIAGNOSIS — R202 Paresthesia of skin: Secondary | ICD-10-CM

## 2017-10-22 ENCOUNTER — Ambulatory Visit
Admission: RE | Admit: 2017-10-22 | Discharge: 2017-10-22 | Disposition: A | Discharge status: Home / Self Care | Payer: Medicare Other | Source: Ambulatory Visit | Attending: Family Medicine | Admitting: Family Medicine

## 2017-10-22 DIAGNOSIS — R202 Paresthesia of skin: Secondary | ICD-10-CM

## 2017-10-25 DIAGNOSIS — N320 Bladder-neck obstruction: Secondary | ICD-10-CM | POA: Insufficient documentation

## 2018-10-31 ENCOUNTER — Other Ambulatory Visit: Payer: Self-pay | Admitting: Otolaryngology

## 2018-10-31 DIAGNOSIS — D3610 Benign neoplasm of peripheral nerves and autonomic nervous system, unspecified: Secondary | ICD-10-CM

## 2018-11-01 ENCOUNTER — Ambulatory Visit
Admission: RE | Admit: 2018-11-01 | Discharge: 2018-11-01 | Disposition: A | Discharge status: Home / Self Care | Payer: Medicare Other | Source: Ambulatory Visit | Attending: Otolaryngology | Admitting: Otolaryngology

## 2018-11-01 ENCOUNTER — Other Ambulatory Visit: Payer: Self-pay

## 2018-11-01 DIAGNOSIS — D3610 Benign neoplasm of peripheral nerves and autonomic nervous system, unspecified: Secondary | ICD-10-CM

## 2018-11-01 MED ORDER — GADOBENATE DIMEGLUMINE 529 MG/ML IV SOLN
14.0000 mL | Freq: Once | INTRAVENOUS | Status: AC | PRN
  Administered 2018-11-01: 14 mL via INTRAVENOUS

## 2019-02-05 DIAGNOSIS — D333 Benign neoplasm of cranial nerves: Secondary | ICD-10-CM | POA: Insufficient documentation

## 2019-04-22 ENCOUNTER — Telehealth: Payer: Self-pay

## 2019-04-22 NOTE — Telephone Encounter (Signed)
Called pt to switch from OV to VIRTUAL with PR 04/27/2019 Monday, left message asking pt to call the office.

## 2019-04-25 NOTE — Progress Notes (Signed)
Virtual Visit via Telephone Note   This visit type was conducted due to national recommendations for restrictions regarding the COVID-19 Pandemic (e.g. social distancing) in an effort to limit this patient's exposure and mitigate transmission in our community.  Due to his co-morbid illnesses, this patient is at least at moderate risk for complications without adequate follow up.  This format is felt to be most appropriate for this patient at this time.  The patient did not have access to video technology/had technical difficulties with video requiring transitioning to audio format only (telephone).  All issues noted in this document were discussed and addressed.  No physical exam could be performed with this format.  Please refer to the patient's chart for his  consent to telehealth for [facility].   Date:  04/27/2019   ID:  KELI LOY, DOB 28-Jan-1931, MRN [id number]  Patient Location: Home Provider Location: Home  PCP:  Lawerance Cruel, MD  Cardiologist:  Harrington Challenger Electrophysiologist:  None   Evaluation Performed:  Follow-Up Visit  Chief Complaint:  F/U for SVT  History of Present Illness:    Leonard Wilcox is a 85 y.o. male with hx of SVT  I saw him in April 2018     Seen by C A Valda Christenson in past   No palpitations   No dizziness except with bending    NO chest tightness  Pt does a little walking   Busy with wife who has dementia     The patient does not have symptoms concerning for COVID-19 infection (fever, chills, cough, or new shortness of breath).    Past Medical History:  Diagnosis Date  . Arthritis    No past surgical history on file.   Current Meds  Medication Sig  . aspirin EC 81 MG tablet Take 81 mg by mouth daily.  . hydrochlorothiazide (HYDRODIURIL) 25 MG tablet Take 25 mg by mouth daily.   . hydroxychloroquine (PLAQUENIL) 200 MG tablet Take 200 mg by mouth 2 (two) times daily.  . ramipril (ALTACE) 10 MG capsule Take 10 mg by mouth daily.       Allergies:   Sulfa antibiotics, Other, and Sporanox [itraconazole]   Social History   Tobacco Use  . Smoking status: Never Smoker  . Smokeless tobacco: Never Used  Substance Use Topics  . Alcohol use: No    Alcohol/week: 0.0 standard drinks  . Drug use: No     Family Hx: The patient's family history includes Dementia in his mother; Stomach cancer in his father.  ROS:   Please see the history of present illness.     All other systems reviewed and are negative.   Prior CV studies:   The following studies were reviewed today:    Labs/Other Tests and Data Reviewed:    EKG:  No ECG reviewed.  Recent Labs: No results found for requested labs within last 8760 hours.   Recent Lipid Panel No results found for: CHOL, TRIG, HDL, CHOLHDL, LDLCALC, LDLDIRECT  Wt Readings from Last 3 Encounters:  04/27/19 156 lb (70.8 kg)  09/17/16 158 lb 12.8 oz (72 kg)  02/27/16 152 lb 12.8 oz (69.3 kg)     Objective:    Vital Signs:  BP 129/89   Pulse 80   Ht 6\' 2"  (1.88 m)   Wt 156 lb (70.8 kg)   BMI 20.03 kg/m    VITAL SIGNS:  reviewed  ASSESSMENT & PLAN:    1. Hx of SVT  No symptoms  to sugg recurrence      2  Hx HTN   BP is OK   Continue meds       COVID-19 Education: The signs and symptoms of COVID-19 were discussed with the patient and how to seek care for testing (follow up with PCP or arrange E-visit).  The importance of social distancing was discussed today.  Time:   Today, I have spent 15 minutes with the patient with telehealth technology discussing the above problems.     Medication Adjustments/Labs and Tests Ordered: Current medicines are reviewed at length with the patient today.  Concerns regarding medicines are outlined above.   Tests Ordered: No orders of the defined types were placed in this encounter.   Medication Changes: No orders of the defined types were placed in this encounter.   Follow Up:   As needed if SVT recurs   Signed, Dorris Carnes, MD  04/27/2019 9:51 AM    [place]

## 2019-04-27 ENCOUNTER — Other Ambulatory Visit: Payer: Self-pay

## 2019-04-27 ENCOUNTER — Encounter: Payer: Self-pay | Admitting: Internal Medicine

## 2019-04-27 ENCOUNTER — Telehealth (INDEPENDENT_AMBULATORY_CARE_PROVIDER_SITE_OTHER): Payer: Medicare Other | Admitting: Internal Medicine

## 2019-04-27 DIAGNOSIS — I10 Essential (primary) hypertension: Secondary | ICD-10-CM

## 2019-04-27 DIAGNOSIS — I471 Supraventricular tachycardia: Secondary | ICD-10-CM

## 2019-04-27 NOTE — Patient Instructions (Signed)
Medication Instructions:  Your physician recommends that you continue on your current medications as directed. Please refer to the Current Medication list given to you today.  *If you need a refill on your cardiac medications before your next appointment, please call your pharmacy*  Lab Work: none If you have labs (blood work) drawn today and your tests are completely normal, you will receive your results only by: Marland Kitchen MyChart Message (if you have MyChart) OR . A paper copy in the mail If you have any lab test that is abnormal or we need to change your treatment, we will call you to review the results.  Testing/Procedures: none  Follow-Up: At [facility], you and your health needs are our priority.  As part of our continuing mission to provide you with exceptional heart care, we have created designated Provider Care Teams.  These Care Teams include your primary Cardiologist (physician) and Advanced Practice Providers (APPs -  Physician Assistants and Nurse Practitioners) who all work together to provide you with the care you need, when you need it.  Follow up to be determined by Dr Harrington Challenger

## 2020-02-05 DIAGNOSIS — R2689 Other abnormalities of gait and mobility: Secondary | ICD-10-CM | POA: Insufficient documentation

## 2020-02-05 DIAGNOSIS — H90A21 Sensorineural hearing loss, unilateral, right ear, with restricted hearing on the contralateral side: Secondary | ICD-10-CM | POA: Insufficient documentation

## 2020-02-05 DIAGNOSIS — H903 Sensorineural hearing loss, bilateral: Secondary | ICD-10-CM | POA: Insufficient documentation

## 2020-03-25 ENCOUNTER — Other Ambulatory Visit: Payer: Self-pay

## 2020-03-25 ENCOUNTER — Ambulatory Visit: Payer: Medicare Other | Admitting: Podiatry

## 2020-03-25 DIAGNOSIS — M79674 Pain in right toe(s): Secondary | ICD-10-CM

## 2020-03-25 DIAGNOSIS — M79675 Pain in left toe(s): Secondary | ICD-10-CM

## 2020-03-25 DIAGNOSIS — L84 Corns and callosities: Secondary | ICD-10-CM

## 2020-03-25 DIAGNOSIS — B351 Tinea unguium: Secondary | ICD-10-CM

## 2020-03-25 DIAGNOSIS — R52 Pain, unspecified: Secondary | ICD-10-CM

## 2020-03-27 NOTE — Progress Notes (Signed)
  Subjective:  Patient ID: Leonard Wilcox, male    DOB: 23-Oct-1930,  MRN: [id number]  Chief Complaint  Patient presents with  . Foot Pain    Right posterior heel pain, mostly at night, occasional and mild. No known injuries.  . Foot Pain    Right plantar forefoot pain, occasional and mild. No known injuries.    86 y.o. male presents with the above complaint. History confirmed with patient.   Objective:  Physical Exam: warm, good capillary refill, no trophic changes or ulcerative lesions, normal DP and PT pulses and normal sensory exam.  Second, elongated, yellowed toenails with subungual debris x10.  On the right foot there is a hyperkeratosis in the posterior medial heel.  This is tender to palpation  Assessment:  No diagnosis found.   Plan:  Patient was evaluated and treated and all questions answered.  Discussed the etiology and treatment options for the condition in detail with the patient. Educated patient on the topical and oral treatment options for mycotic nails. Recommended debridement of the nails today. Sharp and mechanical debridement performed of all painful and mycotic nails today. Nails debrided in length and thickness using a nail nipper and a mechanical burr to level of comfort. Discussed treatment options including appropriate shoe gear. Follow up as needed for painful nails.  All symptomatic hyperkeratoses were safely debrided with a sterile #15 blade to patient's level of comfort without incident. We discussed preventative and palliative care of these lesions including supportive and accommodative shoegear, padding, prefabricated and custom molded accommodative orthoses, use of a pumice stone and lotions/creams daily.   Return in about 3 months (around 06/25/2020).

## 2020-05-04 ENCOUNTER — Ambulatory Visit: Payer: Medicare Other | Attending: Internal Medicine

## 2020-05-04 DIAGNOSIS — Z23 Encounter for immunization: Secondary | ICD-10-CM

## 2020-05-04 NOTE — Progress Notes (Signed)
   Covid-19 Vaccination Clinic  Name:  Leonard Wilcox    MRN: [id number] DOB: Oct 02, 1930  05/04/2020  [name] was observed post Covid-19 immunization for 15 minutes without incident. He was provided with Vaccine Information Sheet and instruction to access the V-Safe system.   [name] was instructed to call 911 with any severe reactions post vaccine: Marland Kitchen Difficulty breathing  . Swelling of face and throat  . A fast heartbeat  . A bad rash all over body  . Dizziness and weakness   Immunizations Administered    No immunizations on file.

## 2020-05-12 ENCOUNTER — Other Ambulatory Visit (HOSPITAL_COMMUNITY): Payer: Self-pay | Admitting: Internal Medicine

## 2020-07-05 ENCOUNTER — Ambulatory Visit: Payer: Medicare Other | Admitting: Podiatry

## 2020-07-05 ENCOUNTER — Other Ambulatory Visit: Payer: Self-pay

## 2020-07-05 ENCOUNTER — Encounter: Payer: Self-pay | Admitting: Podiatry

## 2020-07-05 DIAGNOSIS — R52 Pain, unspecified: Secondary | ICD-10-CM | POA: Diagnosis not present

## 2020-07-05 DIAGNOSIS — I10 Essential (primary) hypertension: Secondary | ICD-10-CM | POA: Insufficient documentation

## 2020-07-05 DIAGNOSIS — N529 Male erectile dysfunction, unspecified: Secondary | ICD-10-CM | POA: Insufficient documentation

## 2020-07-05 DIAGNOSIS — B351 Tinea unguium: Secondary | ICD-10-CM | POA: Diagnosis not present

## 2020-07-05 DIAGNOSIS — G629 Polyneuropathy, unspecified: Secondary | ICD-10-CM | POA: Insufficient documentation

## 2020-07-05 DIAGNOSIS — T63441A Toxic effect of venom of bees, accidental (unintentional), initial encounter: Secondary | ICD-10-CM | POA: Insufficient documentation

## 2020-07-05 DIAGNOSIS — M79675 Pain in left toe(s): Secondary | ICD-10-CM | POA: Diagnosis not present

## 2020-07-05 DIAGNOSIS — E7800 Pure hypercholesterolemia, unspecified: Secondary | ICD-10-CM | POA: Insufficient documentation

## 2020-07-05 DIAGNOSIS — M069 Rheumatoid arthritis, unspecified: Secondary | ICD-10-CM | POA: Insufficient documentation

## 2020-07-05 DIAGNOSIS — R000 Tachycardia, unspecified: Secondary | ICD-10-CM | POA: Insufficient documentation

## 2020-07-05 DIAGNOSIS — D333 Benign neoplasm of cranial nerves: Secondary | ICD-10-CM | POA: Insufficient documentation

## 2020-07-05 DIAGNOSIS — L84 Corns and callosities: Secondary | ICD-10-CM | POA: Diagnosis not present

## 2020-07-05 DIAGNOSIS — M8580 Other specified disorders of bone density and structure, unspecified site: Secondary | ICD-10-CM | POA: Insufficient documentation

## 2020-07-05 DIAGNOSIS — M79674 Pain in right toe(s): Secondary | ICD-10-CM

## 2020-07-05 DIAGNOSIS — M353 Polymyalgia rheumatica: Secondary | ICD-10-CM | POA: Insufficient documentation

## 2020-07-05 DIAGNOSIS — H9190 Unspecified hearing loss, unspecified ear: Secondary | ICD-10-CM | POA: Insufficient documentation

## 2020-07-08 DIAGNOSIS — I10 Essential (primary) hypertension: Secondary | ICD-10-CM | POA: Diagnosis not present

## 2020-07-08 DIAGNOSIS — M0609 Rheumatoid arthritis without rheumatoid factor, multiple sites: Secondary | ICD-10-CM | POA: Diagnosis not present

## 2020-07-08 DIAGNOSIS — M8580 Other specified disorders of bone density and structure, unspecified site: Secondary | ICD-10-CM | POA: Diagnosis not present

## 2020-07-08 DIAGNOSIS — E7800 Pure hypercholesterolemia, unspecified: Secondary | ICD-10-CM | POA: Diagnosis not present

## 2020-07-10 NOTE — Progress Notes (Signed)
Subjective:  Patient ID: Leonard Wilcox, male    DOB: 1930-09-01,  MRN: [id number]  [name] presents to clinic today for painful thick toenails that are difficult to trim. Pain interferes with ambulation. Aggravating factors include wearing enclosed shoe gear. Pain is relieved with periodic professional debridement..   Current Outpatient Medications:  .  B-Complex TABS, 1 tablet, Disp: , Rfl:  .  EPINEPHrine (EPIPEN 2-PAK) 0.3 mg/0.3 mL IJ SOAJ injection, See admin instructions., Disp: , Rfl:  .  Ascorbic Acid (VITAMIN C) 500 MG CAPS, 1 tablet, Disp: , Rfl:  .  aspirin EC 81 MG tablet, Take 81 mg by mouth daily., Disp: , Rfl:  .  Calcium 600-400 MG-UNIT CHEW, 1 tablet, Disp: , Rfl:  .  Cyanocobalamin 2000 MCG TBCR, Take by mouth., Disp: , Rfl:  .  diclofenac Sodium (VOLTAREN) 1 % GEL, Apply topically., Disp: , Rfl:  .  gadobutrol (GADAVIST) 1 MMOL/ML injection, Inject into the vein., Disp: , Rfl:  .  hydrochlorothiazide (HYDRODIURIL) 25 MG tablet, 1 tablet in the morning, Disp: , Rfl:  .  hydroxychloroquine (PLAQUENIL) 200 MG tablet, 1 tablet with food or milk, Disp: , Rfl:  .  loratadine (CLARITIN) 10 MG tablet, 1 tablet, Disp: , Rfl:  .  Magnesium (V-R MAGNESIUM) 250 MG TABS, 1 tablet, Disp: , Rfl:  .  Magnesium Oxide 400 MG CAPS, 1 tablet, Disp: , Rfl:  .  Multiple Vitamins-Minerals (PRESERVISION AREDS 2) CAPS, 1 capsule, Disp: , Rfl:  .  Omega-3 Fatty Acids (FISH OIL) 1000 MG CAPS, 1 capsule with a meal, Disp: , Rfl:  .  ramipril (ALTACE) 10 MG capsule, 1 capsule, Disp: , Rfl:  .  sildenafil (REVATIO) 20 MG tablet, Take 20 mg by mouth 3 (three) times daily., Disp: , Rfl:   Allergies  Allergen Reactions  . Amlodipine Besylate     Other reaction(s): HA, flushed  . Sulfa Antibiotics Other (See Comments)    Allergic to bee sting, carries epi pen with him  . Sulfasalazine Other (See Comments)    Allergic to bee sting, carries epi pen with him  . Zocor [Simvastatin]      Other reaction(s): muscle aches  . Itraconazole Rash    Other reaction(s): does tolerate lamisil/rash  . Other Rash    Sporinox Other reaction(s): anaphylaxis    Review of Systems: Negative except as noted in the HPI. Objective:   Constitutional Leonard Wilcox is a pleasant 87 y.o. African American male, in NAD. AAO x 3.   Vascular Capillary refill time to digits immediate b/l. Palpable pedal pulses b/l LE. Pedal hair sparse. Lower extremity skin temperature gradient within normal limits. No pain with calf compression b/l. No cyanosis or clubbing noted.  Neurologic Normal speech. Oriented to person, place, and time. Protective sensation intact 5/5 intact bilaterally with 10g monofilament b/l. Vibratory sensation intact b/l.  Dermatologic Pedal skin with normal turgor, texture and tone bilaterally. No open wounds bilaterally. No interdigital macerations bilaterally. Toenails 1-5 b/l elongated, discolored, dystrophic, thickened, crumbly with subungual debris and tenderness to dorsal palpation. Hyperkeratotic lesion(s) posterior aspect of heel right foot.  No erythema, no edema, no drainage, no fluctuance.  Orthopedic: Normal muscle strength 5/5 to all lower extremity muscle groups bilaterally. No pain crepitus or joint limitation noted with ROM b/l. No gross bony deformities bilaterally. Patient ambulates independent of any assistive aids.   Radiographs: None Assessment:   1. Pain due to onychomycosis of toenails of both feet  2. Callus of foot   3. Pain    Plan:  Patient was evaluated and treated and all questions answered.  Onychomycosis with pain -Nails palliatively debridement as below -Educated on self-care  Procedure: Nail Debridement Rationale: Pain Type of Debridement: manual, sharp debridement. Instrumentation: Nail nipper, rotary burr. Number of Nails: 10 -Examined patient. -Patient to continue soft, supportive shoe gear daily. -Toenails 1-5 b/l were debrided in  length and girth with sterile nail nippers and dremel without iatrogenic bleeding.  -Callus(es) posterior aspect of heel right foot pared utilizing sterile scalpel blade without complication or incident. Total number debrided =1. -Patient to report any pedal injuries to medical professional immediately. -Patient/POA to call should there be question/concern in the interim.  Return in about 3 months (around 10/02/2020).  Marzetta Board, DPM

## 2020-08-09 DIAGNOSIS — I10 Essential (primary) hypertension: Secondary | ICD-10-CM | POA: Diagnosis not present

## 2020-08-09 DIAGNOSIS — Z6820 Body mass index (BMI) 20.0-20.9, adult: Secondary | ICD-10-CM | POA: Diagnosis not present

## 2020-08-09 DIAGNOSIS — Z79899 Other long term (current) drug therapy: Secondary | ICD-10-CM | POA: Diagnosis not present

## 2020-08-09 DIAGNOSIS — M0609 Rheumatoid arthritis without rheumatoid factor, multiple sites: Secondary | ICD-10-CM | POA: Diagnosis not present

## 2020-08-09 DIAGNOSIS — M2550 Pain in unspecified joint: Secondary | ICD-10-CM | POA: Diagnosis not present

## 2020-08-09 DIAGNOSIS — M353 Polymyalgia rheumatica: Secondary | ICD-10-CM | POA: Diagnosis not present

## 2020-08-11 DIAGNOSIS — D333 Benign neoplasm of cranial nerves: Secondary | ICD-10-CM | POA: Diagnosis not present

## 2020-08-11 DIAGNOSIS — H90A21 Sensorineural hearing loss, unilateral, right ear, with restricted hearing on the contralateral side: Secondary | ICD-10-CM | POA: Diagnosis not present

## 2020-08-11 DIAGNOSIS — H903 Sensorineural hearing loss, bilateral: Secondary | ICD-10-CM | POA: Diagnosis not present

## 2020-08-11 DIAGNOSIS — Z923 Personal history of irradiation: Secondary | ICD-10-CM | POA: Diagnosis not present

## 2020-09-08 DIAGNOSIS — E7800 Pure hypercholesterolemia, unspecified: Secondary | ICD-10-CM | POA: Diagnosis not present

## 2020-09-08 DIAGNOSIS — M0609 Rheumatoid arthritis without rheumatoid factor, multiple sites: Secondary | ICD-10-CM | POA: Diagnosis not present

## 2020-09-08 DIAGNOSIS — I10 Essential (primary) hypertension: Secondary | ICD-10-CM | POA: Diagnosis not present

## 2020-09-08 DIAGNOSIS — M8580 Other specified disorders of bone density and structure, unspecified site: Secondary | ICD-10-CM | POA: Diagnosis not present

## 2020-10-17 ENCOUNTER — Other Ambulatory Visit: Payer: Self-pay

## 2020-10-17 ENCOUNTER — Ambulatory Visit: Payer: Medicare Other | Admitting: Podiatry

## 2020-10-17 DIAGNOSIS — M79674 Pain in right toe(s): Secondary | ICD-10-CM

## 2020-10-17 DIAGNOSIS — M79675 Pain in left toe(s): Secondary | ICD-10-CM | POA: Diagnosis not present

## 2020-10-17 DIAGNOSIS — B351 Tinea unguium: Secondary | ICD-10-CM

## 2020-10-18 ENCOUNTER — Ambulatory Visit: Payer: Medicare Other | Admitting: Podiatry

## 2020-10-20 ENCOUNTER — Encounter: Payer: Self-pay | Admitting: Podiatry

## 2020-10-20 DIAGNOSIS — R209 Unspecified disturbances of skin sensation: Secondary | ICD-10-CM | POA: Insufficient documentation

## 2020-10-20 NOTE — Progress Notes (Signed)
Subjective: Leonard Wilcox is a pleasant 87 y.o. male patient seen today painful thick toenails that are difficult to trim. Pain interferes with ambulation. Aggravating factors include wearing enclosed shoe gear. Pain is relieved with periodic professional debridement.  PCP is Lawerance Cruel, MD. Last visit was: one year ago.  He voices no new pedal problems on today's visit.  Allergies  Allergen Reactions  . Amlodipine Besylate     Other reaction(s): HA, flushed  . Sulfa Antibiotics Other (See Comments)    Allergic to bee sting, carries epi pen with him  . Sulfasalazine Other (See Comments)    Allergic to bee sting, carries epi pen with him  . Zocor [Simvastatin]     Other reaction(s): muscle aches  . Itraconazole Rash    Other reaction(s): does tolerate lamisil/rash  . Other Rash    Sporinox Other reaction(s): anaphylaxis    Objective: Physical Exam  General: Leonard Wilcox is a pleasant 87 y.o. African American male, WD, WN in NAD. AAO x 3.   Vascular:  Capillary refill time to digits immediate b/l. Palpable pedal pulses b/l LE. Pedal hair sparse. Lower extremity skin temperature gradient within normal limits.  Dermatological:  Pedal skin with normal turgor, texture and tone bilaterally. No open wounds bilaterally. No interdigital macerations bilaterally. Toenails 1-5 b/l elongated, discolored, dystrophic, thickened, crumbly with subungual debris and tenderness to dorsal palpation.  Musculoskeletal:  Normal muscle strength 5/5 to all lower extremity muscle groups bilaterally. No pain crepitus or joint limitation noted with ROM b/l.  Neurological:  Protective sensation intact 5/5 intact bilaterally with 10g monofilament b/l. Vibratory sensation intact b/l.  Assessment and Plan:  1. Pain due to onychomycosis of toenails of both feet     -Examined patient. -No new findings. No new orders. -Patient to continue soft, supportive shoe gear daily. -Toenails 1-5 b/l  were debrided in length and girth with sterile nail nippers and dremel without iatrogenic bleeding.  -Patient to report any pedal injuries to medical professional immediately. -Patient/POA to call should there be question/concern in the interim.  Return in about 3 months (around 01/17/2021).  Marzetta Board, DPM

## 2020-11-07 DIAGNOSIS — M8580 Other specified disorders of bone density and structure, unspecified site: Secondary | ICD-10-CM | POA: Diagnosis not present

## 2020-11-07 DIAGNOSIS — E7800 Pure hypercholesterolemia, unspecified: Secondary | ICD-10-CM | POA: Diagnosis not present

## 2020-11-07 DIAGNOSIS — M0609 Rheumatoid arthritis without rheumatoid factor, multiple sites: Secondary | ICD-10-CM | POA: Diagnosis not present

## 2020-11-07 DIAGNOSIS — I10 Essential (primary) hypertension: Secondary | ICD-10-CM | POA: Diagnosis not present

## 2020-11-10 DIAGNOSIS — D333 Benign neoplasm of cranial nerves: Secondary | ICD-10-CM | POA: Diagnosis not present

## 2020-11-29 DIAGNOSIS — Z23 Encounter for immunization: Secondary | ICD-10-CM | POA: Diagnosis not present

## 2020-11-29 DIAGNOSIS — I10 Essential (primary) hypertension: Secondary | ICD-10-CM | POA: Diagnosis not present

## 2020-11-29 DIAGNOSIS — Z0000 Encounter for general adult medical examination without abnormal findings: Secondary | ICD-10-CM | POA: Diagnosis not present

## 2020-11-29 DIAGNOSIS — D333 Benign neoplasm of cranial nerves: Secondary | ICD-10-CM | POA: Diagnosis not present

## 2020-11-29 DIAGNOSIS — E7800 Pure hypercholesterolemia, unspecified: Secondary | ICD-10-CM | POA: Diagnosis not present

## 2020-12-07 DIAGNOSIS — I10 Essential (primary) hypertension: Secondary | ICD-10-CM | POA: Diagnosis not present

## 2020-12-07 DIAGNOSIS — E7800 Pure hypercholesterolemia, unspecified: Secondary | ICD-10-CM | POA: Diagnosis not present

## 2020-12-07 DIAGNOSIS — Z0000 Encounter for general adult medical examination without abnormal findings: Secondary | ICD-10-CM | POA: Diagnosis not present

## 2020-12-22 DIAGNOSIS — I10 Essential (primary) hypertension: Secondary | ICD-10-CM | POA: Diagnosis not present

## 2020-12-22 DIAGNOSIS — E7800 Pure hypercholesterolemia, unspecified: Secondary | ICD-10-CM | POA: Diagnosis not present

## 2020-12-22 DIAGNOSIS — M0609 Rheumatoid arthritis without rheumatoid factor, multiple sites: Secondary | ICD-10-CM | POA: Diagnosis not present

## 2020-12-22 DIAGNOSIS — M8580 Other specified disorders of bone density and structure, unspecified site: Secondary | ICD-10-CM | POA: Diagnosis not present

## 2021-01-03 DIAGNOSIS — M8580 Other specified disorders of bone density and structure, unspecified site: Secondary | ICD-10-CM | POA: Diagnosis not present

## 2021-01-03 DIAGNOSIS — E7800 Pure hypercholesterolemia, unspecified: Secondary | ICD-10-CM | POA: Diagnosis not present

## 2021-01-03 DIAGNOSIS — M0609 Rheumatoid arthritis without rheumatoid factor, multiple sites: Secondary | ICD-10-CM | POA: Diagnosis not present

## 2021-01-03 DIAGNOSIS — I10 Essential (primary) hypertension: Secondary | ICD-10-CM | POA: Diagnosis not present

## 2021-01-23 ENCOUNTER — Other Ambulatory Visit: Payer: Self-pay

## 2021-01-23 ENCOUNTER — Ambulatory Visit: Payer: Medicare Other | Admitting: Podiatry

## 2021-01-23 DIAGNOSIS — R52 Pain, unspecified: Secondary | ICD-10-CM | POA: Diagnosis not present

## 2021-01-23 DIAGNOSIS — L84 Corns and callosities: Secondary | ICD-10-CM

## 2021-01-23 DIAGNOSIS — B351 Tinea unguium: Secondary | ICD-10-CM

## 2021-01-23 DIAGNOSIS — M79675 Pain in left toe(s): Secondary | ICD-10-CM

## 2021-01-23 DIAGNOSIS — M79674 Pain in right toe(s): Secondary | ICD-10-CM | POA: Diagnosis not present

## 2021-01-24 DIAGNOSIS — M5416 Radiculopathy, lumbar region: Secondary | ICD-10-CM | POA: Diagnosis not present

## 2021-01-25 ENCOUNTER — Encounter: Payer: Self-pay | Admitting: Podiatry

## 2021-01-25 NOTE — Progress Notes (Signed)
  Subjective:  Patient ID: Leonard Wilcox, male    DOB: Aug 04, 1930,  MRN: [id number]  87 y.o. male presents painful thick toenails that are difficult to trim. Pain interferes with ambulation. Aggravating factors include wearing enclosed shoe gear. Pain is relieved with periodic professional debridement.  He has h/o RA. He is primary caretaker of his wife who has dementia. She is present during today's visit.  PCP is Lawerance Cruel, MD , and last visit was 07/08/2020.  Allergies  Allergen Reactions   Amlodipine Besylate     Other reaction(s): HA, flushed   Sulfa Antibiotics Other (See Comments)    Allergic to bee sting, carries epi pen with him   Sulfasalazine Other (See Comments)    Allergic to bee sting, carries epi pen with him   Zocor [Simvastatin]     Other reaction(s): muscle aches   Itraconazole Rash    Other reaction(s): does tolerate lamisil/rash   Other Rash    Sporinox Other reaction(s): anaphylaxis    Review of Systems: Negative except as noted in the HPI.   Objective:  Vascular Examination: Vascular status intact b/l with palpable pedal pulses. CFT immediate b/l. No edema. No pain with calf compression b/l. Skin temperature gradient WNL b/l.   Neurological Examination: Sensation grossly intact b/l with 10 gram monofilament. Vibratory sensation intact b/l.   Dermatological Examination: Pedal skin with normal turgor, texture and tone b/l. Toenails 1-5 b/l thick, discolored, elongated with subungual debris and pain on dorsal palpation.  Hyperkeratotic lesion(s) right foot.  No erythema, no edema, no drainage, no fluctuance.  Musculoskeletal Examination: Muscle strength 5/5 to b/l LE.  Radiographs: None Assessment:   1. Pain due to onychomycosis of toenails of both feet   2. Callus of foot   3. Pain     Plan:  -Examined patient. -Patient to continue soft, supportive shoe gear daily. -Toenails 1-5 b/l were debrided in length and girth with sterile nail  nippers and dremel without iatrogenic bleeding.  -Callus(es) right foot pared utilizing sterile scalpel blade without complication or incident. Total number debrided =1. -Patient to report any pedal injuries to medical professional immediately. -Patient/POA to call should there be question/concern in the interim.  Return in about 3 months (around 04/25/2021).  Marzetta Board, DPM

## 2021-02-02 DIAGNOSIS — M79672 Pain in left foot: Secondary | ICD-10-CM | POA: Diagnosis not present

## 2021-02-06 ENCOUNTER — Other Ambulatory Visit: Payer: Self-pay | Admitting: *Deleted

## 2021-02-06 NOTE — Patient Outreach (Signed)
[facility]) Care Management  02/06/2021  Leonard Wilcox [birth year] [id number]  Referral from [facility] Telephone Screen-Unsuccessful Outreach #1  Pt called [facility] RN hotline related to LE edema. RN returned a call today for possible screening and follow up accordingly for any additional interventions however unsuccessful. RN able to leave a HIPAA approved voice message requesting a call back.  Will follow up once again over the next week.  Raina Mina, RN Care Management Coordinator [place] Office [phone]

## 2021-02-07 DIAGNOSIS — M545 Low back pain, unspecified: Secondary | ICD-10-CM | POA: Diagnosis not present

## 2021-02-09 ENCOUNTER — Other Ambulatory Visit: Payer: Self-pay | Admitting: *Deleted

## 2021-02-09 ENCOUNTER — Other Ambulatory Visit: Payer: Self-pay | Admitting: Rheumatology

## 2021-02-09 DIAGNOSIS — Z681 Body mass index (BMI) 19 or less, adult: Secondary | ICD-10-CM | POA: Diagnosis not present

## 2021-02-09 DIAGNOSIS — M353 Polymyalgia rheumatica: Secondary | ICD-10-CM | POA: Diagnosis not present

## 2021-02-09 DIAGNOSIS — M2550 Pain in unspecified joint: Secondary | ICD-10-CM | POA: Diagnosis not present

## 2021-02-09 DIAGNOSIS — R600 Localized edema: Secondary | ICD-10-CM | POA: Diagnosis not present

## 2021-02-09 DIAGNOSIS — M0609 Rheumatoid arthritis without rheumatoid factor, multiple sites: Secondary | ICD-10-CM | POA: Diagnosis not present

## 2021-02-09 DIAGNOSIS — Z79899 Other long term (current) drug therapy: Secondary | ICD-10-CM | POA: Diagnosis not present

## 2021-02-09 NOTE — Patient Outreach (Signed)
[facility]) Care Management  02/09/2021  ROMOLO SILVAN Jun 22, 1930 [id number]   Telephone Screening-Unsuccessful Outreach #2  RN attempted outreach call unsuccessful. RN able to leave a HIPAA approved voice message requesting a call back.  Will continue to engage at that time and/or attempt another outreach call over the next week.  Raina Mina, RN Care Management Coordinator [place] Office [phone]

## 2021-02-10 ENCOUNTER — Ambulatory Visit
Admission: RE | Admit: 2021-02-10 | Discharge: 2021-02-10 | Disposition: A | Discharge status: Home / Self Care | Payer: Medicare Other | Source: Ambulatory Visit | Attending: Rheumatology | Admitting: Rheumatology

## 2021-02-10 DIAGNOSIS — R600 Localized edema: Secondary | ICD-10-CM

## 2021-02-14 ENCOUNTER — Other Ambulatory Visit: Payer: Self-pay | Admitting: *Deleted

## 2021-02-14 NOTE — Patient Outreach (Signed)
[name]) Care Management  02/14/2021  Leonard Wilcox [birth year] [id number]   Telephone Screening Outreach #3  RN attempted outreach call today however remains unsuccessful. RN was able to leave a HIPAA approved voice message requesting a call back.  Will attempted another outreach over the next few weeks.  Raina Mina, RN Care Management Coordinator [place] Office [phone]

## 2021-02-14 NOTE — Patient Outreach (Signed)
[facility]) Care Management  02/14/2021  DMITRIY GAIR [birth year] [id number]  Telephone Assessment-Referral to a Health Coach (HTN)  Pt returned a call to RN case Freight forwarder. RN explained the purpose for the previous calls and explained [facility] services. Pt initially called the RN hotline for swelling to his LE. Pt states his issues has reduced and continues to resolve. States he has underwent MRI, Doppler and an X-Ray. Pt states his taking walking classes to prevent clotting.   Pt states he is doing well with no acute issues and currently decline [facility] for monthly case management services but receptive to  Health Coach for quarterly follow up in monitoring his ongoing blood pressures.   Will make a referral for [facility] for [facility] ongoing services.  Raina Mina, RN Care Management Coordinator [place] Office [phone]

## 2021-02-17 ENCOUNTER — Other Ambulatory Visit: Payer: Self-pay | Admitting: *Deleted

## 2021-02-20 DIAGNOSIS — M545 Low back pain, unspecified: Secondary | ICD-10-CM | POA: Diagnosis not present

## 2021-02-28 DIAGNOSIS — R269 Unspecified abnormalities of gait and mobility: Secondary | ICD-10-CM | POA: Diagnosis not present

## 2021-02-28 DIAGNOSIS — M79605 Pain in left leg: Secondary | ICD-10-CM | POA: Diagnosis not present

## 2021-02-28 DIAGNOSIS — M6281 Muscle weakness (generalized): Secondary | ICD-10-CM | POA: Diagnosis not present

## 2021-03-02 DIAGNOSIS — H90A21 Sensorineural hearing loss, unilateral, right ear, with restricted hearing on the contralateral side: Secondary | ICD-10-CM | POA: Diagnosis not present

## 2021-03-02 DIAGNOSIS — R2689 Other abnormalities of gait and mobility: Secondary | ICD-10-CM | POA: Diagnosis not present

## 2021-03-02 DIAGNOSIS — D333 Benign neoplasm of cranial nerves: Secondary | ICD-10-CM | POA: Diagnosis not present

## 2021-03-02 DIAGNOSIS — H903 Sensorineural hearing loss, bilateral: Secondary | ICD-10-CM | POA: Diagnosis not present

## 2021-03-02 DIAGNOSIS — Z923 Personal history of irradiation: Secondary | ICD-10-CM | POA: Diagnosis not present

## 2021-03-06 DIAGNOSIS — R269 Unspecified abnormalities of gait and mobility: Secondary | ICD-10-CM | POA: Diagnosis not present

## 2021-03-06 DIAGNOSIS — M6281 Muscle weakness (generalized): Secondary | ICD-10-CM | POA: Diagnosis not present

## 2021-03-06 DIAGNOSIS — M79605 Pain in left leg: Secondary | ICD-10-CM | POA: Diagnosis not present

## 2021-03-08 ENCOUNTER — Encounter: Payer: Self-pay | Admitting: *Deleted

## 2021-03-08 ENCOUNTER — Other Ambulatory Visit: Payer: Self-pay | Admitting: *Deleted

## 2021-03-08 DIAGNOSIS — M6281 Muscle weakness (generalized): Secondary | ICD-10-CM | POA: Diagnosis not present

## 2021-03-08 DIAGNOSIS — M79605 Pain in left leg: Secondary | ICD-10-CM | POA: Diagnosis not present

## 2021-03-08 DIAGNOSIS — R269 Unspecified abnormalities of gait and mobility: Secondary | ICD-10-CM | POA: Diagnosis not present

## 2021-03-08 NOTE — Patient Instructions (Addendum)
Goals Addressed             This Visit's Progress    [facility]) Patient will verbalize maintaining his weight or gaining weight withn the next 90 days       Timeframe:  Long-Range Goal Priority:  Medium Start Date:  03/08/21                           Expected End Date: 03/26/22 Follow Up Dated: 06/26/21  -Encouraged patient to continue to eat high calorie diet that is healthy -Encouraged patient to continue to stay well hydrated by drinking water and healthy fluids -Discussed drinking high protein and high calorie nutritional supplements (may google high calorie nutritional supplements that are lactose free or go to [facility] to get an idea of what is available)    -Eat small amounts frequently throughout the day. High protein and high calorie foods are best.  -Weigh several times weekly to ensure you are maintaining weight  Note: 03/08/21: Patient shared that he has been tall and slender all of his life. Patient discovered that he was losing weight and has since concentrated on eating a high calorie diet. He reports gaining his weight back for the most part but expressed the importance of maintaining his nutritional status due to caring for his wife. Nurse will send patient via email emmi education of high calorie, high protein diet and minimize weight loss.                      [facility]) Patient will verbalize monitoring his B/P 2 times a week and recording the values in log within the next 90 days       Timeframe:  Long-Range Goal Priority:  Medium Start Date: 03/08/21                            Expected End Date:  03/26/22                     Follow Up Date 06/26/21    - write blood pressure results in a log or diary -Encouraged patient to take his B/P and record the value 2 times a week -Discussed limiting salt in his diet -Encouraged patient to continue to be physically active -Discussed continuation of limiting stress by enjoying his hobbies     Why is this important?   You won't feel  high blood pressure, but it can still hurt your blood vessels.  High blood pressure can cause heart or kidney problems. It can also cause a stroke.  Making lifestyle changes like losing a little weight or eating less salt will help.  Checking your blood pressure at home and at different times of the day can help to control blood pressure.  If the doctor prescribes medicine remember to take it the way the doctor ordered.  Call the office if you cannot afford the medicine or if there are questions about it.     Notes: 03/08/21: Patient reports taking his B/P occasionally. He explains that his value is usually around 130/90 range. Patient states that he does stay physically active and that he is eating a healthy diet. Nurse will send patient hypertension education.

## 2021-03-08 NOTE — Patient Outreach (Signed)
[facility]) Care Management  [place]  03/08/2021   KRISHANG READING 13-Oct-1930 [id number]  Subjective: Successful telephone outreach call to patient. HIPAA identifiers obtained. Patient states he is dong well at this time. Patient explains that his left leg continues to be swollen. He reports that the swelling has gone down from previously and that the doppler study was negative for DVT. He denies any leg discoloration, hot spots or knots and he does evaluate the leg often. Patient states that he has begun PT to help with the swelling, pain, and to increase mobility. Nurse discussed with patient to call his PCP to inform them that he continues to have leg swelling. Patient stated that he would call his PCP to inform. Patient is independent with ADLs and IADLs. He reports that his home environment is safe and denies any recent falls or needs for DME. Nurse discussed with patient his health goals and his health and wellness needs which were documented in the Epic system.  Encounter Medications:  Outpatient Encounter Medications as of 03/08/2021  Medication Sig Note   Ascorbic Acid (VITAMIN C) 500 MG CAPS 1 tablet    B-Complex TABS 1 tablet    Calcium 600-400 MG-UNIT CHEW 1 tablet    Cyanocobalamin 2000 MCG TBCR Take by mouth.    diclofenac Sodium (VOLTAREN) 1 % GEL Apply topically.    EPINEPHrine 0.3 mg/0.3 mL IJ SOAJ injection See admin instructions.    hydrochlorothiazide (HYDRODIURIL) 25 MG tablet 1 tablet in the morning    hydroxychloroquine (PLAQUENIL) 200 MG tablet 1 tablet with food or milk    loratadine (CLARITIN) 10 MG tablet 1 tablet    Magnesium 250 MG TABS 1 tablet    Magnesium Oxide 400 MG CAPS 1 tablet    Multiple Vitamins-Minerals (PRESERVISION AREDS 2) CAPS 1 capsule    Omega-3 Fatty Acids (FISH OIL) 1000 MG CAPS 1 capsule with a meal    ramipril (ALTACE) 10 MG capsule 1 capsule    sildenafil (REVATIO) 20 MG tablet Take 20 mg by mouth 3 (three)  times daily.    aspirin EC 81 MG tablet Take 81 mg by mouth daily. (Patient not taking: Reported on 03/08/2021) 03/08/2021: Discontinued by provider   COVID-19 mRNA vaccine, Moderna, 100 MCG/0.5ML injection USE AS DIRECTED (Patient not taking: Reported on 03/08/2021) 03/08/2021: completed   gadobutrol (GADAVIST) 1 MMOL/ML injection Inject into the vein. (Patient not taking: Reported on 03/08/2021) 03/08/2021: completed   No facility-administered encounter medications on file as of 03/08/2021.    Functional Status:  No flowsheet data found.  Fall/Depression Screening: Fall Risk  03/08/2021  Falls in the past year? 0  Number falls in past yr: 0  Injury with Fall? 0  Risk for fall due to : Impaired vision;Impaired mobility  Follow up Falls prevention discussed;Education provided;Falls evaluation completed   PHQ 2/9 Scores 03/08/2021  PHQ - 2 Score 0    Assessment:   Care Plan Care Plan : Hypertension (Adult)  Updates made by Michiel Cowboy, RN since 03/08/2021 12:00 AM     Problem: Hypertension (Hypertension)   Priority: Medium     Long-Range Goal: Hypertension Monitored   Start Date: 03/08/2021  Expected End Date: 03/26/2022  Note:   Evidence-based guidance:  Promote initial use of ambulatory blood pressure measurements (for 3 days) to rule out "white-coat" effect; identify masked hypertension and presence or absence of nocturnal "dipping" of blood pressure.   Encourage continued use of home blood pressure monitoring  and recording in blood pressure log; include symptoms of hypotension or potential medication side effects in log.  Review blood pressure measurements taken inside and outside of the provider office; establish baseline and monitor trends; compare to target ranges or patient goal.  Share overall cardiovascular risk with patient; encourage changes to lifestyle risk factors, including alcohol consumption, smoking, inadequate exercise, poor dietary habits and stress.    Notes:     Task: Identify and Monitor Blood Pressure Elevation   Due Date: 03/26/2022  Note:   Care Management Activities:    - blood pressure trends reviewed - home or ambulatory blood pressure monitoring encouraged    Notes:       Goals Addressed             This Visit's Progress    [facility]) Patient will verbalize maintaining his weight or gaining weight withn the next 90 days       Timeframe:  Long-Range Goal Priority:  Medium Start Date:  03/08/21                           Expected End Date: 03/26/22 Follow Up Dated: 06/26/21  -Encouraged patient to continue to eat high calorie diet that is healthy -Encouraged patient to continue to stay well hydrated by drinking water and healthy fluids -Discussed drinking high protein and high calorie nutritional supplements (may google high calorie nutritional supplements that are lactose free or go to [facility] to get an idea of what is available)    -Eat small amounts frequently throughout the day. High protein and high calorie foods are best.  -Weigh several times weekly to ensure you are maintaining weight  Note: 03/08/21: Patient shared that he has been tall and slender all of his life. Patient discovered that he was losing weight and has since concentrated on eating a high calorie diet. He reports gaining his weight back for the most part but expressed the importance of maintaining his nutritional status due to caring for his wife. Nurse will send patient via email emmi education of high calorie, high protein diet and minimize weight loss.                      [facility]) Patient will verbalize monitoring his B/P 2 times a week and recording the values in log within the next 90 days       Timeframe:  Long-Range Goal Priority:  Medium Start Date: 03/08/21                            Expected End Date:  03/26/22                     Follow Up Date 06/26/21    - write blood pressure results in a log or diary -Encouraged patient to take his  B/P and record the value 2 times a week -Discussed limiting salt in his diet -Encouraged patient to continue to be physically active -Discussed continuation of limiting stress by enjoying his hobbies     Why is this important?   You won't feel high blood pressure, but it can still hurt your blood vessels.  High blood pressure can cause heart or kidney problems. It can also cause a stroke.  Making lifestyle changes like losing a little weight or eating less salt will help.  Checking your blood pressure at home and at different times  of the day can help to control blood pressure.  If the doctor prescribes medicine remember to take it the way the doctor ordered.  Call the office if you cannot afford the medicine or if there are questions about it.     Notes: 03/08/21: Patient reports taking his B/P occasionally. He explains that his value is usually around 130/90 range. Patient states that he does stay physically active and that he is eating a healthy diet. Nurse will send patient hypertension education.        Plan: RN Health Coach will send PCP a barrier latter and today's assessment note, will send patient hypertension and high calorie diet education, and will call patient within the month of January.  Follow-up: Patient agrees to Care Plan and Follow-up.   Emelia Loron RN, BSN [place] [phone] [email or web address]

## 2021-03-09 ENCOUNTER — Encounter: Payer: Self-pay | Admitting: *Deleted

## 2021-03-14 ENCOUNTER — Ambulatory Visit: Payer: Self-pay | Admitting: *Deleted

## 2021-03-14 DIAGNOSIS — R269 Unspecified abnormalities of gait and mobility: Secondary | ICD-10-CM | POA: Diagnosis not present

## 2021-03-14 DIAGNOSIS — M6281 Muscle weakness (generalized): Secondary | ICD-10-CM | POA: Diagnosis not present

## 2021-03-14 DIAGNOSIS — M79605 Pain in left leg: Secondary | ICD-10-CM | POA: Diagnosis not present

## 2021-03-16 DIAGNOSIS — M6281 Muscle weakness (generalized): Secondary | ICD-10-CM | POA: Diagnosis not present

## 2021-03-16 DIAGNOSIS — M79605 Pain in left leg: Secondary | ICD-10-CM | POA: Diagnosis not present

## 2021-03-16 DIAGNOSIS — R269 Unspecified abnormalities of gait and mobility: Secondary | ICD-10-CM | POA: Diagnosis not present

## 2021-03-21 DIAGNOSIS — R269 Unspecified abnormalities of gait and mobility: Secondary | ICD-10-CM | POA: Diagnosis not present

## 2021-03-21 DIAGNOSIS — M6281 Muscle weakness (generalized): Secondary | ICD-10-CM | POA: Diagnosis not present

## 2021-03-21 DIAGNOSIS — M79605 Pain in left leg: Secondary | ICD-10-CM | POA: Diagnosis not present

## 2021-03-23 DIAGNOSIS — M6281 Muscle weakness (generalized): Secondary | ICD-10-CM | POA: Diagnosis not present

## 2021-03-23 DIAGNOSIS — R269 Unspecified abnormalities of gait and mobility: Secondary | ICD-10-CM | POA: Diagnosis not present

## 2021-03-23 DIAGNOSIS — M79605 Pain in left leg: Secondary | ICD-10-CM | POA: Diagnosis not present

## 2021-03-28 DIAGNOSIS — R269 Unspecified abnormalities of gait and mobility: Secondary | ICD-10-CM | POA: Diagnosis not present

## 2021-03-28 DIAGNOSIS — M79605 Pain in left leg: Secondary | ICD-10-CM | POA: Diagnosis not present

## 2021-03-28 DIAGNOSIS — M6281 Muscle weakness (generalized): Secondary | ICD-10-CM | POA: Diagnosis not present

## 2021-03-30 DIAGNOSIS — M6281 Muscle weakness (generalized): Secondary | ICD-10-CM | POA: Diagnosis not present

## 2021-03-30 DIAGNOSIS — M79605 Pain in left leg: Secondary | ICD-10-CM | POA: Diagnosis not present

## 2021-03-30 DIAGNOSIS — R269 Unspecified abnormalities of gait and mobility: Secondary | ICD-10-CM | POA: Diagnosis not present

## 2021-04-04 DIAGNOSIS — M79605 Pain in left leg: Secondary | ICD-10-CM | POA: Diagnosis not present

## 2021-04-04 DIAGNOSIS — M6281 Muscle weakness (generalized): Secondary | ICD-10-CM | POA: Diagnosis not present

## 2021-04-04 DIAGNOSIS — R269 Unspecified abnormalities of gait and mobility: Secondary | ICD-10-CM | POA: Diagnosis not present

## 2021-04-06 DIAGNOSIS — M79605 Pain in left leg: Secondary | ICD-10-CM | POA: Diagnosis not present

## 2021-04-06 DIAGNOSIS — M6281 Muscle weakness (generalized): Secondary | ICD-10-CM | POA: Diagnosis not present

## 2021-04-06 DIAGNOSIS — R269 Unspecified abnormalities of gait and mobility: Secondary | ICD-10-CM | POA: Diagnosis not present

## 2021-04-07 ENCOUNTER — Telehealth: Payer: Self-pay | Admitting: Internal Medicine

## 2021-04-07 NOTE — Telephone Encounter (Signed)
Pt c/o swelling: STAT is pt has developed SOB within 24 hours  If swelling, where is the swelling located? Left leg ankle, half way to the knee.   How much weight have you gained and in what time span? No   Have you gained 3 pounds in a day or 5 pounds in a week? no  Do you have a log of your daily weights (if so, list)? no  Are you currently taking a fluid pill? No   Are you currently SOB? No   Have you traveled recently? no   States this started back in September first in his ankle, then it has moved up leg half way to his knee.

## 2021-04-07 NOTE — Telephone Encounter (Signed)
Called patient about his message. Patient stated he is having swelling in his ankle and halfway up to his knee. Patient stated this has been going on since the first of September. Patient stated he has some tingling and some sharp pain that comes and goes. Patient stated he has a history of neuropathy. Patient stated he has had xray's and dopplers done on this leg and everything has been fine. Patient's last in office visit with Dr. Harrington Challenger was back in 2019. Patient did have a virtual visit in 2020 and was to follow up as needed for SVT. Encouraged patient to call his PCP about this message. Patient stated he has called him and he said for him to see his orthopedic doctor. Will send to Dr. Harrington Challenger for advisement.

## 2021-04-10 DIAGNOSIS — R600 Localized edema: Secondary | ICD-10-CM | POA: Diagnosis not present

## 2021-04-26 DIAGNOSIS — Z79899 Other long term (current) drug therapy: Secondary | ICD-10-CM | POA: Diagnosis not present

## 2021-04-26 DIAGNOSIS — H26491 Other secondary cataract, right eye: Secondary | ICD-10-CM | POA: Diagnosis not present

## 2021-04-26 DIAGNOSIS — H524 Presbyopia: Secondary | ICD-10-CM | POA: Diagnosis not present

## 2021-04-26 DIAGNOSIS — H353133 Nonexudative age-related macular degeneration, bilateral, advanced atrophic without subfoveal involvement: Secondary | ICD-10-CM | POA: Diagnosis not present

## 2021-04-28 ENCOUNTER — Other Ambulatory Visit: Payer: Self-pay

## 2021-04-28 ENCOUNTER — Ambulatory Visit: Payer: Medicare Other | Admitting: Podiatry

## 2021-04-28 ENCOUNTER — Encounter: Payer: Self-pay | Admitting: Podiatry

## 2021-04-28 DIAGNOSIS — R52 Pain, unspecified: Secondary | ICD-10-CM | POA: Diagnosis not present

## 2021-04-28 DIAGNOSIS — B351 Tinea unguium: Secondary | ICD-10-CM | POA: Diagnosis not present

## 2021-04-28 DIAGNOSIS — M419 Scoliosis, unspecified: Secondary | ICD-10-CM | POA: Insufficient documentation

## 2021-04-28 DIAGNOSIS — M79675 Pain in left toe(s): Secondary | ICD-10-CM

## 2021-04-28 DIAGNOSIS — M79674 Pain in right toe(s): Secondary | ICD-10-CM

## 2021-04-28 DIAGNOSIS — L84 Corns and callosities: Secondary | ICD-10-CM

## 2021-04-28 DIAGNOSIS — F430 Acute stress reaction: Secondary | ICD-10-CM | POA: Insufficient documentation

## 2021-05-01 NOTE — Telephone Encounter (Signed)
I spoke with the pt and he is still having bilateral peripheral edema... the tinging he reported previously has improved some.. I made him an appt to be seen by Dr. Harrington Challenger this Wed 05/03/21 at 9 am.

## 2021-05-02 NOTE — Progress Notes (Signed)
Cardiology Office Note   Date:  05/03/2021   ID:  Leonard Wilcox, DOB 02-11-1931, MRN [id number]  PCP:  Lawerance Cruel, Leonard  Cardiologist:   Dorris Carnes, Leonard   Pt returns for f/u of SVT       History of Present Illness: Leonard Wilcox is a 87 y.o. male  with hx of  SVT    I saw the pt in 2018.  He had a virtual visit back in 2020.  Was for as needed follow-up.  The pt called in on 04/07/21 complaining of increased swelling in his ankles/legs.  Swelling startedstarted in September.  Also notes some tingling and in pain sensation.  He does have a history of neuropathy.  Lower extremity Dopplers reported okay.  He denies change in diet   Pt says he wore compression socks.  Also took water pill for 3 wks    Pt denies SOB  Denies  CP  No dizziness  No palpitations    Says that the lasix 20 mg doesn't cause him  urinate much more     Notes no change in diet    Outpatient Medications Prior to Visit  Medication Sig Dispense Refill   Ascorbic Acid (VITAMIN C) 500 MG CAPS 1 tablet     B-Complex TABS 1 tablet     Calcium 600-400 MG-UNIT CHEW 1 tablet     Cyanocobalamin 2000 MCG TBCR Take by mouth.     diclofenac Sodium (VOLTAREN) 1 % GEL Apply topically.     EPINEPHrine 0.3 mg/0.3 mL IJ SOAJ injection See admin instructions.     hydrochlorothiazide (HYDRODIURIL) 25 MG tablet 1 tablet in the morning     hydroxychloroquine (PLAQUENIL) 200 MG tablet 1 tablet with food or milk     loratadine (CLARITIN) 10 MG tablet 1 tablet     Magnesium 250 MG TABS 1 tablet     Magnesium Oxide 400 MG CAPS 1 tablet     Multiple Vitamins-Minerals (PRESERVISION AREDS 2) CAPS 1 capsule     Omega-3 Fatty Acids (FISH OIL) 1000 MG CAPS 1 capsule with a meal     ramipril (ALTACE) 10 MG capsule 1 capsule     sildenafil (REVATIO) 20 MG tablet Take 20 mg by mouth 3 (three) times daily.     aspirin EC 81 MG tablet Take 81 mg by mouth daily. (Patient not taking: Reported on 03/08/2021)     COVID-19 mRNA vaccine,  Moderna, 100 MCG/0.5ML injection USE AS DIRECTED (Patient not taking: Reported on 03/08/2021) .25 mL 0   gadobutrol (GADAVIST) 1 MMOL/ML injection Inject into the vein. (Patient not taking: Reported on 03/08/2021)     No facility-administered medications prior to visit.     Allergies:   Amlodipine besylate, Bee venom, Simvastatin, Sulfa antibiotics, Sulfasalazine, Itraconazole, and Other   Past Medical History:  Diagnosis Date   Arthritis     Past Surgical History:  Procedure Laterality Date   BRAIN SURGERY     EYE SURGERY       Social History:  The patient  reports that he has never smoked. He has never used smokeless tobacco. He reports that he does not drink alcohol and does not use drugs.   Family History:  The patient's family history includes Dementia in his mother; Stomach cancer in his father.    ROS:  Please see the history of present illness. All other systems are reviewed and  Negative to the above problem except as noted.  PHYSICAL EXAM: VS:  BP 138/82   Pulse 84   Ht 6\' 2"  (1.88 m)   Wt 154 lb 6.4 oz (70 kg)   SpO2 99%   BMI 19.82 kg/m   GEN: Well nourished, well developed, in no acute distress  HEENT: normal  Neck: no JVD, carotid bruits, or masses Cardiac: RRR; no murmurs, rubs, or gallops, 1+ pitting edema  Respiratory:  clear to auscultation bilaterally, GI: soft, nontender, nondistended, + BS  No hepatomegaly  MS: no deformity Moving all extremities   Skin: warm and dry, no rash Neuro:  Strength and sensation are intact Psych: euthymic mood, full affect   EKG:  EKG is  ordered today.  SR  LVH   84 bpm     Lipid Panel No results found for: CHOL, TRIG, HDL, CHOLHDL, VLDL, LDLCALC, LDLDIRECT    Wt Readings from Last 3 Encounters:  05/03/21 154 lb 6.4 oz (70 kg)  04/27/19 156 lb (70.8 kg)  09/17/16 158 lb 12.8 oz (72 kg)      ASSESSMENT AND PLAN:  1  LE edema   Except for some edema in legs, overall volume status looks OK     WIll check  CMET.     2 HTN   BP is fair   Continue current meds for now   Follow   3  Hx SVT   No recurrence    Current medicines are reviewed at length with the patient today.  The patient does not have concerns regarding medicines.    Signed, Dorris Carnes, Leonard  05/03/2021 10:12 AM    [address], [place], [place]  [id number] Phone: [phone]; Fax: [phone]

## 2021-05-02 NOTE — Progress Notes (Signed)
  Subjective:  Patient ID: Leonard Wilcox, male    DOB: [birth year],  MRN: [id number]  [place] presents to clinic today for callus(es) right foot and painful thick toenails that are difficult to trim. Painful toenails interfere with ambulation. Aggravating factors include wearing enclosed shoe gear. Pain is relieved with periodic professional debridement. Painful calluses are aggravated when weightbearing with and without shoegear. Pain is relieved with periodic professional debridement.  Patient notes no new pedal concerns on today's visit.  PCP is Lawerance Cruel, MD , and last visit was 04/03/2021.  Allergies  Allergen Reactions   Amlodipine Besylate     Other reaction(s): HA, flushed   Bee Venom     Other reaction(s): anaphylaxis   Simvastatin     Other reaction(s): muscle aches Other reaction(s): muscle aches   Sulfa Antibiotics Other (See Comments)    Allergic to bee sting, carries epi pen with him   Sulfasalazine Other (See Comments)    Allergic to bee sting, carries epi pen with him   Itraconazole Rash    Other reaction(s): does tolerate lamisil/rash   Other Rash    Sporinox Other reaction(s): anaphylaxis    Review of Systems: Negative except as noted in the HPI. Objective:   Constitutional Leonard Wilcox is a pleasant 87 y.o. African American male, WD, WN in NAD. AAO x 3.   Vascular CFT immediate b/l LE. Palpable DP/PT pulses b/l LE. Digital hair sparse b/l. Skin temperature gradient WNL b/l. No pain with calf compression b/l. No edema noted b/l. No cyanosis or clubbing noted b/l LE.  Neurologic Normal speech. Oriented to person, place, and time. Protective sensation intact 5/5 intact bilaterally with 10g monofilament b/l. Vibratory sensation intact b/l.  Dermatologic Pedal integument with normal turgor, texture and tone b/l LE. No open wounds b/l. No interdigital macerations b/l. Toenails 1-5 b/l elongated, thickened, discolored with subungual debris.  +Tenderness with dorsal palpation of nailplates. Hyperkeratotic lesion(s) noted posterior aspect of heel right foot.  Orthopedic: Muscle strength 5/5 to all lower extremity muscle groups bilaterally. Patient ambulates independent of any assistive aids.   Radiographs: None   Assessment:   1. Pain due to onychomycosis of toenails of both feet   2. Callus of foot   3. Pain    Plan:  Patient was evaluated and treated and all questions answered. Consent given for treatment as described below: -No new findings. No new orders. -Medicare ABN signed for this year. Patient consents for services of paring of callus right foot  today. Copy has been placed in patient's chart. -Mycotic toenails 1-5 bilaterally were debrided in length and girth with sterile nail nippers and dremel without incident. -Callus(es) right foot pared utilizing sterile scalpel blade without complication or incident. Total number debrided =1. -Patient/POA to call should there be question/concern in the interim.  Return in about 3 months (around 07/27/2021).  Marzetta Board, DPM

## 2021-05-03 ENCOUNTER — Encounter: Payer: Self-pay | Admitting: Internal Medicine

## 2021-05-03 ENCOUNTER — Ambulatory Visit: Payer: Medicare Other | Admitting: Internal Medicine

## 2021-05-03 ENCOUNTER — Other Ambulatory Visit: Payer: Self-pay

## 2021-05-03 VITALS — BP 138/82 | HR 84 | Ht 74.0 in | Wt 154.4 lb

## 2021-05-03 DIAGNOSIS — I10 Essential (primary) hypertension: Secondary | ICD-10-CM | POA: Diagnosis not present

## 2021-05-03 DIAGNOSIS — R609 Edema, unspecified: Secondary | ICD-10-CM

## 2021-05-03 LAB — COMPREHENSIVE METABOLIC PANEL
ALT: 37 IU/L (ref 0–44)
AST: 36 IU/L (ref 0–40)
Albumin/Globulin Ratio: 2 (ref 1.2–2.2)
Albumin: 4.5 g/dL (ref 3.6–4.6)
Alkaline Phosphatase: 72 IU/L (ref 44–121)
BUN/Creatinine Ratio: 25 — ABNORMAL HIGH (ref 10–24)
BUN: 26 mg/dL (ref 8–27)
Bilirubin Total: 0.9 mg/dL (ref 0.0–1.2)
CO2: 30 mmol/L — ABNORMAL HIGH (ref 20–29)
Calcium: 9.5 mg/dL (ref 8.6–10.2)
Chloride: 100 mmol/L (ref 96–106)
Creatinine, Ser: 1.02 mg/dL (ref 0.76–1.27)
Globulin, Total: 2.2 g/dL (ref 1.5–4.5)
Glucose: 73 mg/dL (ref 70–99)
Potassium: 3.7 mmol/L (ref 3.5–5.2)
Sodium: 142 mmol/L (ref 134–144)
Total Protein: 6.7 g/dL (ref 6.0–8.5)
eGFR: 71 mL/min/{1.73_m2} (ref >59)

## 2021-05-03 LAB — TSH: TSH: 5.18 u[IU]/mL — ABNORMAL HIGH (ref 0.450–4.500)

## 2021-05-03 NOTE — Patient Instructions (Signed)
Medication Instructions:  Your physician recommends that you continue on your current medications as directed. Please refer to the Current Medication list given to you today.  *If you need a refill on your cardiac medications before your next appointment, please call your pharmacy*   Lab Work: CMET AND TSH If you have labs (blood work) drawn today and your tests are completely normal, you will receive your results only by: [facility] (if you have MyChart) OR A paper copy in the mail If you have any lab test that is abnormal or we need to change your treatment, we will call you to review the results.   Testing/Procedures: Your physician has requested that you have an echocardiogram. Echocardiography is a painless test that uses sound waves to create images of your heart. It provides your doctor with information about the size and shape of your heart and how well your heart's chambers and valves are working. This procedure takes approximately one hour. There are no restrictions for this procedure.     Follow-Up: At [facility], you and your health needs are our priority.  As part of our continuing mission to provide you with exceptional heart care, we have created designated Provider Care Teams.  These Care Teams include your primary Cardiologist (physician) and Advanced Practice Providers (APPs -  Physician Assistants and Nurse Practitioners) who all work together to provide you with the care you need, when you need it.  We recommend signing up for the patient portal called "MyChart".  Sign up information is provided on this After Visit Summary.  MyChart is used to connect with patients for Virtual Visits (Telemedicine).  Patients are able to view lab/test results, encounter notes, upcoming appointments, etc.  Non-urgent messages can be sent to your provider as well.   To learn more about what you can do with MyChart, go to [email or web address].    Your next appointment:   3  month(s)  The format for your next appointment:   In Person  Provider:   Dorris Carnes, MD     Other Instructions

## 2021-05-05 ENCOUNTER — Telehealth: Payer: Self-pay

## 2021-05-05 DIAGNOSIS — R600 Localized edema: Secondary | ICD-10-CM

## 2021-05-05 DIAGNOSIS — Z79899 Other long term (current) drug therapy: Secondary | ICD-10-CM

## 2021-05-05 DIAGNOSIS — R609 Edema, unspecified: Secondary | ICD-10-CM

## 2021-05-05 DIAGNOSIS — I10 Essential (primary) hypertension: Secondary | ICD-10-CM

## 2021-05-05 NOTE — Telephone Encounter (Signed)
I spoke with the pt re: his lab results and he reports that this is not a good time for him to go over the results and Dr. Harrington Challenger' new recommendations.   He says his wife is in the hospital and he is taking a quick nap and is very tired.... I apologized to him for what he is going through and to let us know when there is a bter time coming up that we can go over everything with him again.. he took our number and I also advised that we can try to call him back again early next week and he agreed.

## 2021-05-05 NOTE — Telephone Encounter (Signed)
-----   Message from Fay Records, MD sent at 05/04/2021 11:34 PM EST ----- Electrolytes and kidney function are OK Liver function is OK Thyroid stimulating hormone is minimally elevated  Set up for free T3, Free T4 at his convenience I would use support socks, elevate legs   Watch salt Can try 40 Lasix with 20 Meq KCL   1 time per week

## 2021-05-09 NOTE — Telephone Encounter (Signed)
Attempted to call the pt but phone kept ringing, no answer.... will try again at a later time.

## 2021-05-16 DIAGNOSIS — Z681 Body mass index (BMI) 19 or less, adult: Secondary | ICD-10-CM | POA: Diagnosis not present

## 2021-05-16 DIAGNOSIS — M353 Polymyalgia rheumatica: Secondary | ICD-10-CM | POA: Diagnosis not present

## 2021-05-16 DIAGNOSIS — M0609 Rheumatoid arthritis without rheumatoid factor, multiple sites: Secondary | ICD-10-CM | POA: Diagnosis not present

## 2021-05-16 DIAGNOSIS — Z79899 Other long term (current) drug therapy: Secondary | ICD-10-CM | POA: Diagnosis not present

## 2021-05-16 DIAGNOSIS — M2550 Pain in unspecified joint: Secondary | ICD-10-CM | POA: Diagnosis not present

## 2021-05-25 ENCOUNTER — Other Ambulatory Visit: Payer: Self-pay

## 2021-05-25 ENCOUNTER — Ambulatory Visit (HOSPITAL_COMMUNITY): Payer: Medicare Other | Attending: Cardiovascular Disease

## 2021-05-25 DIAGNOSIS — R609 Edema, unspecified: Secondary | ICD-10-CM | POA: Diagnosis not present

## 2021-05-25 DIAGNOSIS — I10 Essential (primary) hypertension: Secondary | ICD-10-CM | POA: Diagnosis not present

## 2021-05-25 LAB — ECHOCARDIOGRAM COMPLETE
AR max vel: 2.74 cm2
AV Area VTI: 2.58 cm2
AV Area mean vel: 2.49 cm2
AV Mean grad: 4 mmHg
AV Peak grad: 6.6 mmHg
Ao pk vel: 1.28 m/s
Area-P 1/2: 4.15 cm2
P 1/2 time: 542 msec
S' Lateral: 3 cm

## 2021-06-01 ENCOUNTER — Other Ambulatory Visit: Payer: Self-pay | Admitting: *Deleted

## 2021-06-01 NOTE — Patient Instructions (Signed)
Visit Information  Thank you for taking time to visit with me today. Please don't hesitate to contact me if I can be of assistance to you before our next scheduled telephone appointment.  Following are the goals we discussed today:  Patient Goals/Self-Care Activities: Take all medications as prescribed Attend all scheduled provider appointments Attend church or other social activities check blood pressure 3 times per week keep a blood pressure log take blood pressure log to all doctor appointments call doctor for signs and symptoms of high blood pressure keep all doctor appointments take medications for blood pressure exactly as prescribed eat more whole grains, fruits and vegetables, lean meats and healthy fats Maintain your weight by drinking or eating nutritional supplements ([place] will have lactose free if needed) Begin to go to [employer] as you did previously, continue to walk, and do exercises Try to limit the amount of sodium you consume to 2300 mg a day  The patient verbalized understanding of instructions, educational materials, and care plan provided today and agreed to receive a mailed copy of patient instructions, educational materials, and care plan.   Telephone follow up appointment with care management team member scheduled UPB:DHDIX  Emelia Loron RN, [place] [phone] [email or web address]

## 2021-06-01 NOTE — Telephone Encounter (Signed)
After several attempts finally reached the pt... he has informed me that his wife passed away 06-19-21 after a sudden short stay in the hospital.   Pt says he has been doing fairly well.    Per the NP at his PCP office... about a month ago he has added lasix 20 mg to his regimen. No K supplements. He says he is still having some bilateral peripheral edema... he says he can still wear his normal shoes and does help some when he elevates them. No SOB.   Will forward to Dr. Harrington Challenger for her review.   Pt will plan to have thyroid labs after he hears back from me in case he needs any further kidney function tests due to potential med change.

## 2021-06-01 NOTE — Patient Outreach (Signed)
[facility]) Care Management  06/01/2021  Leonard Wilcox 01-25-1931 [id number]  [facility]) Care Management RN Health Coach Note   06/01/2021 Name:  Leonard Wilcox MRN:  [id number] DOB:  [birth year]  Summary: Patient reports that his wife passed away over the New Year holiday. Nurse offered patient sincere condolences. Patient states he has a lot of family and church support and declined grief counseling at this time. Patient explained that he has not recently been monitoring his B/P due to his wife's severe illness. Patient states he plans to start to monitor his B/P 3 times a week and record the values. He has been adhering to a low sodium diet and is planning to return to [employer] to increase his physical and social activity. Patient states his home environment is safe and did confirm that he has this nurse's contact number to call her if needed.   Recommendations/Changes made from today's visit: check blood pressure 3 times per week keep a blood pressure log Maintain your weight by drinking or eating nutritional supplements ([place] will have lactose free if needed) Begin to go to [employer] as you did previously, continue to walk, and do exercises Try to limit the amount of sodium you consume to 2300 mg a day  Subjective: Leonard Wilcox is an 88 y.o. year old male who is a primary patient of Harrington Challenger, Dwyane Luo, MD. The care management team was consulted for assistance with care management and/or care coordination needs.    RN Health Coach completed Telephone Visit today.   Objective:  Medications Reviewed Today     Reviewed by Michiel Cowboy, RN (Registered Nurse) on 06/01/21 at 79  Med List Status: <None>   Medication Order Taking? Sig Documenting Provider Last Dose Status Informant  Ascorbic Acid (VITAMIN C) 500 MG CAPS [id number] Yes 1 tablet [provider] Taking Active   aspirin EC 81 MG tablet [id number] Yes  Take 81 mg by mouth daily. [provider] Taking Active Self           Med Note Laretta Alstrom, Marquett Bertoli A   Wed Mar 08, 2021 12:17 PM) Discontinued by provider  B-Complex TABS [id number] Yes 1 tablet [provider] Taking Active   Calcium 600-400 MG-UNIT CHEW [id number] Yes 1 tablet [provider] Taking Active   Cyanocobalamin 2000 [place] [id number] Yes Take by mouth. [provider] Taking Active   diclofenac Sodium (VOLTAREN) 1 % GEL [id number] Yes Apply topically. [provider] Taking Active   EPINEPHrine 0.3 mg/0.3 mL IJ SOAJ injection [id number] Yes See admin instructions. [provider] Taking Active   gadobutrol (GADAVIST) 1 MMOL/ML injection [id number] Yes Inject into the vein. [provider] Taking Active            Med Note Laretta Alstrom, Marianne Golightly A   Wed Mar 08, 2021 12:18 PM) completed  hydrochlorothiazide (HYDRODIURIL) 25 MG tablet [id number] Yes 1 tablet in the morning [provider] Taking Active   hydroxychloroquine (PLAQUENIL) 200 MG tablet [id number] Yes 1 tablet with food or milk [provider] Taking Active   loratadine (CLARITIN) 10 MG tablet [id number] Yes 1 tablet [provider] Taking Active   Magnesium 250 MG TABS [id number] Yes 1 tablet [provider] Taking Active   Magnesium Oxide 400 MG CAPS [id number] Yes 1 tablet [provider] Taking Active   Multiple Vitamins-Minerals (PRESERVISION AREDS 2) CAPS [id number] Yes 1 capsule [provider] Taking Active  Omega-3 Fatty Acids (FISH OIL) 1000 MG CAPS [id number] Yes 1 capsule with a meal [provider] Taking Active   ramipril (ALTACE) 10 MG capsule [id number] Yes 1 capsule [provider] Taking Active   sildenafil (REVATIO) 20 MG tablet [id number] Yes Take 20 mg by mouth 3 (three) times daily. [provider] Taking Active              SDOH:  (Social Determinants of Health) assessments and  interventions performed: SDOH assessments completed today and documented in the Epic system.    Care Plan  Review of patient past medical history, allergies, medications, health status, including review of consultants reports, laboratory and other test data, was performed as part of comprehensive evaluation for care management services.   Care Plan : Hypertension (Adult)  Updates made by Michiel Cowboy, RN since 06/01/2021 12:00 AM     Problem: Hypertension (Hypertension) Resolved 06/01/2021  Priority: Medium     Long-Range Goal: Hypertension Monitored Completed 06/01/2021  Start Date: 03/08/2021  Expected End Date: 03/26/2022  Note:   Resolving due to duplicate goal  Evidence-based guidance:  Promote initial use of ambulatory blood pressure measurements (for 3 days) to rule out "white-coat" effect; identify masked hypertension and presence or absence of nocturnal "dipping" of blood pressure.   Encourage continued use of home blood pressure monitoring and recording in blood pressure log; include symptoms of hypotension or potential medication side effects in log.  Review blood pressure measurements taken inside and outside of the provider office; establish baseline and monitor trends; compare to target ranges or patient goal.  Share overall cardiovascular risk with patient; encourage changes to lifestyle risk factors, including alcohol consumption, smoking, inadequate exercise, poor dietary habits and stress.   Notes:     Task: Identify and Monitor Blood Pressure Elevation Completed 06/01/2021  Due Date: 03/26/2022  Note:   Care Management Activities:    - blood pressure trends reviewed - home or ambulatory blood pressure monitoring encouraged    Notes:     Care Plan : [place] of Care  Updates made by Michiel Cowboy, RN since 06/01/2021 12:00 AM     Problem: Knowledge Deficit Related to Hypertension   Priority: High     Long-Range Goal: Development of Plan of Care for  the Management of Hypertension   Start Date: 06/01/2021  Expected End Date: 06/26/2021  Note:   Current Barriers:  Chronic Disease Management support and education needs related to HTN   RNCM Clinical Goal(s):  Patient will demonstrate Improved adherence to prescribed treatment plan for HTN as evidenced by continuation of taking his medications as prescribed, staying physically active, and adhering to a low sodium diet; beginning to take his B/P 3 times a week and record the values continue to work with Consulting civil engineer to address care management and care coordination needs related to  HTN as evidenced by adherence to CM Team Scheduled appointments through collaboration with RN Care manager, provider, and care team.   Interventions: Inter-disciplinary care team collaboration (see longitudinal plan of care) Evaluation of current treatment plan related to  self management and patient's adherence to plan as established by provider   Hypertension Interventions:  (Status:  Goal on track:  Yes.) Long Term Goal Last practice recorded BP readings:  BP Readings from Last 3 Encounters:  05/03/21 138/82  04/27/19 129/89  09/17/16 (!) 142/82  Most recent eGFR/CrCl:  Lab Results  Component Value Date   EGFR 71 05/03/2021  No components found for: CRCL  Evaluation of current treatment plan related to hypertension self management and patient's adherence to plan as established by provider Reviewed medications with patient and discussed importance of compliance Discussed plans with patient for ongoing care management follow up and provided patient with direct contact information for care management team Provided education on prescribed diet low sodium diet Discussed complications of poorly controlled blood pressure such as heart disease, stroke, circulatory complications, vision complications, kidney impairment, sexual dysfunction Discussed monitoring his B/P 3 times a week and recording the values;  contacting provider for concerning values Encouraged patient to stay physically active by re-joining Silver Sneakers and continuation of walking  Patient Goals/Self-Care Activities: Take all medications as prescribed Attend all scheduled provider appointments Attend church or other social activities check blood pressure 3 times per week keep a blood pressure log take blood pressure log to all doctor appointments call doctor for signs and symptoms of high blood pressure keep all doctor appointments take medications for blood pressure exactly as prescribed eat more whole grains, fruits and vegetables, lean meats and healthy fats Maintain your weight by drinking or eating nutritional supplements ([place] will have lactose free if needed) Begin to go to [employer] as you did previously, continue to walk, and do exercises Try to limit the amount of sodium you consume to 2300 mg a day  Follow Up Plan:  Telephone follow up appointment with care management team member scheduled for:  March       Plan: Telephone follow up appointment with care management team member scheduled for:  March  Emelia Loron RN, [place] [phone] [email or web address]

## 2021-06-02 NOTE — Telephone Encounter (Addendum)
Pt will have labs next week says he will try to make it around 06/07/21. He will watch his NA intake and elevate his legs when ever he is sitting.

## 2021-06-02 NOTE — Addendum Note (Signed)
Addended by: Stephani Police on: 06/02/2021 01:06 PM   Modules accepted: Orders

## 2021-06-07 ENCOUNTER — Other Ambulatory Visit: Payer: Medicare Other | Admitting: *Deleted

## 2021-06-07 ENCOUNTER — Other Ambulatory Visit: Payer: Self-pay

## 2021-06-07 DIAGNOSIS — Z79899 Other long term (current) drug therapy: Secondary | ICD-10-CM

## 2021-06-07 DIAGNOSIS — I10 Essential (primary) hypertension: Secondary | ICD-10-CM | POA: Diagnosis not present

## 2021-06-07 DIAGNOSIS — R609 Edema, unspecified: Secondary | ICD-10-CM

## 2021-06-07 LAB — T4, FREE: Free T4: 1.35 ng/dL (ref 0.82–1.77)

## 2021-06-07 LAB — BASIC METABOLIC PANEL
BUN/Creatinine Ratio: 28 — ABNORMAL HIGH (ref 10–24)
BUN: 23 mg/dL (ref 8–27)
CO2: 30 mmol/L — ABNORMAL HIGH (ref 20–29)
Calcium: 9.1 mg/dL (ref 8.6–10.2)
Chloride: 97 mmol/L (ref 96–106)
Creatinine, Ser: 0.81 mg/dL (ref 0.76–1.27)
Glucose: 89 mg/dL (ref 70–99)
Potassium: 3.7 mmol/L (ref 3.5–5.2)
Sodium: 136 mmol/L (ref 134–144)
eGFR: 85 mL/min/{1.73_m2} (ref >59)

## 2021-06-07 LAB — T3, FREE: T3, Free: 2.2 pg/mL (ref 2.0–4.4)

## 2021-06-20 ENCOUNTER — Other Ambulatory Visit: Payer: Self-pay

## 2021-06-20 MED ORDER — FUROSEMIDE 20 MG PO TABS: 20.0000 mg | ORAL_TABLET | Freq: Every day | ORAL | 0 refills | Status: DC

## 2021-06-20 NOTE — Telephone Encounter (Signed)
Leonard Wilcox, does Dr. Harrington Challenger want to fill this medication. Please advise. Thanks

## 2021-06-29 DIAGNOSIS — H353132 Nonexudative age-related macular degeneration, bilateral, intermediate dry stage: Secondary | ICD-10-CM | POA: Diagnosis not present

## 2021-06-29 DIAGNOSIS — H33311 Horseshoe tear of retina without detachment, right eye: Secondary | ICD-10-CM | POA: Diagnosis not present

## 2021-06-29 DIAGNOSIS — Z961 Presence of intraocular lens: Secondary | ICD-10-CM | POA: Diagnosis not present

## 2021-06-29 DIAGNOSIS — H35372 Puckering of macula, left eye: Secondary | ICD-10-CM | POA: Diagnosis not present

## 2021-06-29 DIAGNOSIS — H33022 Retinal detachment with multiple breaks, left eye: Secondary | ICD-10-CM | POA: Diagnosis not present

## 2021-08-01 ENCOUNTER — Ambulatory Visit: Payer: Medicare Other | Admitting: Internal Medicine

## 2021-08-07 ENCOUNTER — Ambulatory Visit: Payer: Medicare Other | Admitting: Podiatry

## 2021-08-08 ENCOUNTER — Encounter: Payer: Self-pay | Admitting: Internal Medicine

## 2021-08-08 ENCOUNTER — Ambulatory Visit: Payer: Medicare Other | Admitting: Internal Medicine

## 2021-08-08 ENCOUNTER — Other Ambulatory Visit: Payer: Self-pay

## 2021-08-08 VITALS — BP 128/70 | HR 81 | Ht 74.0 in | Wt 157.8 lb

## 2021-08-08 DIAGNOSIS — R609 Edema, unspecified: Secondary | ICD-10-CM

## 2021-08-08 DIAGNOSIS — I10 Essential (primary) hypertension: Secondary | ICD-10-CM

## 2021-08-08 DIAGNOSIS — Z79899 Other long term (current) drug therapy: Secondary | ICD-10-CM | POA: Diagnosis not present

## 2021-08-08 LAB — BASIC METABOLIC PANEL
BUN/Creatinine Ratio: 31 — ABNORMAL HIGH (ref 10–24)
BUN: 26 mg/dL (ref 8–27)
CO2: 29 mmol/L (ref 20–29)
Calcium: 9.3 mg/dL (ref 8.6–10.2)
Chloride: 100 mmol/L (ref 96–106)
Creatinine, Ser: 0.83 mg/dL (ref 0.76–1.27)
Glucose: 95 mg/dL (ref 70–99)
Potassium: 3.8 mmol/L (ref 3.5–5.2)
Sodium: 139 mmol/L (ref 134–144)
eGFR: 84 mL/min/{1.73_m2} (ref >59)

## 2021-08-08 NOTE — Patient Instructions (Signed)
Medication Instructions:  ?Your physician recommends that you continue on your current medications as directed. Please refer to the Current Medication list given to you today. ? ?*If you need a refill on your cardiac medications before your next appointment, please call your pharmacy* ? ? ?Lab Work: ?BMET ?If you have labs (blood work) drawn today and your tests are completely normal, you will receive your results only by: ?MyChart Message (if you have MyChart) OR ?A paper copy in the mail ?If you have any lab test that is abnormal or we need to change your treatment, we will call you to review the results. ? ? ?Testing/Procedures: ?NONE ? ? ?Follow-Up: ?At [facility], you and your health needs are our priority.  As part of our continuing mission to provide you with exceptional heart care, we have created designated Provider Care Teams.  These Care Teams include your primary Cardiologist (physician) and Advanced Practice Providers (APPs -  Physician Assistants and Nurse Practitioners) who all work together to provide you with the care you need, when you need it. ? ?We recommend signing up for the patient portal called "MyChart".  Sign up information is provided on this After Visit Summary.  MyChart is used to connect with patients for Virtual Visits (Telemedicine).  Patients are able to view lab/test results, encounter notes, upcoming appointments, etc.  Non-urgent messages can be sent to your provider as well.   ?To learn more about what you can do with MyChart, go to [email or web address].   ? ?Your next appointment:   ?9 month(s) ? ?The format for your next appointment:   ?In Person ? ?Provider:   ?Dorris Carnes, MD   ? ? ?Other Instructions ?  ?

## 2021-08-08 NOTE — Progress Notes (Signed)
? ?Cardiology Office Note ? ? ?Date:  08/08/2021  ? ?ID:  Leonard Wilcox, DOB Sep 05, 1930, MRN [id number] ? ?PCP:  Lawerance Cruel, MD  ?Cardiologist:   Dorris Carnes, MD  ? ?Pt returns for f/u of edema   ?  ?History of Present Illness: ?Leonard Wilcox is a 88 y.o. male  with hx of  SVT   ? I saw [name] in November.  He had called in complaining of increased swelling in his ankles starting in September.  Also noted tingling sensation discomfort.  He has a history of neuropathy.  Lower extremity Dopplers arterial have been fine in the past.  He wears compression socks.  After I saw him, and after evaluation of labs recommended increasing Lasix to 40 mg/day 1 time per week. ? ?In the interval he has done okay.  He says he still gets a little lower extremity edema.  He is just taking 20 mg Lasix because he is also on HydroDIURIL 25. ? ?He denies dizziness.  No chest pressure.  No shortness of breath.  He denies cramping/claudication. ? ?Outpatient Medications Prior to Visit  ?Medication Sig Dispense Refill  ? Ascorbic Acid (VITAMIN C) 500 MG CAPS 1 tablet    ? Calcium 600-400 MG-UNIT CHEW 1 tablet    ? Cyanocobalamin 2000 MCG TBCR Take by mouth.    ? diclofenac Sodium (VOLTAREN) 1 % GEL Apply topically.    ? EPINEPHrine 0.3 mg/0.3 mL IJ SOAJ injection See admin instructions.    ? furosemide (LASIX) 20 MG tablet Take 1 tablet (20 mg total) by mouth daily. 90 tablet 0  ? hydrochlorothiazide (HYDRODIURIL) 25 MG tablet 1 tablet in the morning    ? hydroxychloroquine (PLAQUENIL) 200 MG tablet 1 tablet with food or milk    ? loratadine (CLARITIN) 10 MG tablet 1 tablet    ? Magnesium 250 MG TABS 1 tablet    ? Magnesium Oxide 400 MG CAPS 1 tablet    ? Multiple Vitamins-Minerals (PRESERVISION AREDS 2) CAPS 1 capsule    ? Omega-3 Fatty Acids (FISH OIL) 1000 MG CAPS 1 capsule with a meal    ? ramipril (ALTACE) 10 MG capsule 1 capsule    ? aspirin EC 81 MG tablet Take 81 mg by mouth daily. (Patient not taking: Reported on  08/08/2021)    ? B-Complex TABS 1 tablet (Patient not taking: Reported on 08/08/2021)    ? sildenafil (REVATIO) 20 MG tablet Take 20 mg by mouth 3 (three) times daily. (Patient not taking: Reported on 08/08/2021)    ? gadobutrol (GADAVIST) 1 MMOL/ML injection Inject into the vein. (Patient not taking: Reported on 08/08/2021)    ? ?No facility-administered medications prior to visit.  ? ? ? ?Allergies:   Amlodipine besylate, Bee venom, Simvastatin, Sulfa antibiotics, Sulfasalazine, Itraconazole, and Other  ? ?Past Medical History:  ?Diagnosis Date  ? Arthritis   ? ? ?Past Surgical History:  ?Procedure Laterality Date  ? BRAIN SURGERY    ? EYE SURGERY    ? ? ? ?Social History:  The patient  reports that he has never smoked. He has never used smokeless tobacco. He reports that he does not drink alcohol and does not use drugs.  ? ?Family History:  The patient's family history includes Dementia in his mother; Stomach cancer in his father.  ? ? ?ROS:  Please see the history of present illness. All other systems are reviewed and  Negative to the above problem except as noted.  ? ? ?  PHYSICAL EXAM: ?VS:  BP 128/70 (BP Location: Left Arm, Patient Position: Sitting, Cuff Size: Normal)   Pulse 81   Ht 6' 2" (1.88 m)   Wt 157 lb 12.8 oz (71.6 kg)   SpO2 99%   BMI 20.26 kg/m?   ?GEN: Well nourished, well developed, in no acute distress  ?HEENT: normal  ?Neck: no JVD, carotid bruits ?Cardiac: RRR; no murmurs, rubs, or gallops, trivial lower extremity edema edema  ?Respiratory:  clear to auscultation bilaterally, ?GI: soft, nontender, nondistended, + BS  No hepatomegaly  ?MS: no deformity Moving all extremities   ?Skin: warm and dry, no rash ?Neuro:  Strength and sensation are intact ?Psych: euthymic mood, full affect ? ? ?EKG:  EKG is not done ? ? ?Lipid Panel ?No results found for: CHOL, TRIG, HDL, CHOLHDL, VLDL, LDLCALC, LDLDIRECT ?  ? ?Wt Readings from Last 3 Encounters:  ?08/08/21 157 lb 12.8 oz (71.6 kg)  ?05/03/21 154 lb  6.4 oz (70 kg)  ?04/27/19 156 lb (70.8 kg)  ?  ? ? ?ASSESSMENT AND PLAN: ? ?1  LE edema edema overall is not bad.  Is really trivial.  I would keep him on the same regimen.  Use compression socks.  Stay active.  Check be met today  ? ?2 HTN   BP is  ? ?3  Hx SVT   No recurrence  ? ?Plan for follow-up in December. ?Current medicines are reviewed at length with the patient today.  The patient does not have concerns regarding medicines. ? ? ? ?Signed, ?Dorris Carnes, MD  ?08/08/2021 9:58 AM    ?[facility] ?[address], [place], [place]  [address] ?Phone: [phone]; Fax: [phone]  ? ? ?

## 2021-08-09 ENCOUNTER — Ambulatory Visit: Payer: Medicare Other | Admitting: Podiatry

## 2021-08-09 DIAGNOSIS — M79675 Pain in left toe(s): Secondary | ICD-10-CM | POA: Diagnosis not present

## 2021-08-09 DIAGNOSIS — M79671 Pain in right foot: Secondary | ICD-10-CM

## 2021-08-09 DIAGNOSIS — B351 Tinea unguium: Secondary | ICD-10-CM

## 2021-08-09 DIAGNOSIS — M79674 Pain in right toe(s): Secondary | ICD-10-CM

## 2021-08-09 DIAGNOSIS — L84 Corns and callosities: Secondary | ICD-10-CM | POA: Diagnosis not present

## 2021-08-10 ENCOUNTER — Encounter: Payer: Self-pay | Admitting: Internal Medicine

## 2021-08-15 ENCOUNTER — Encounter: Payer: Self-pay | Admitting: Podiatry

## 2021-08-15 NOTE — Progress Notes (Signed)
?  Subjective:  ?Patient ID: Leonard Wilcox, male    DOB: 1931-05-12,  MRN: [id number] ? ?Leonard Wilcox presents to clinic today for callus(es) right foot and painful thick toenails that are difficult to trim. Painful toenails interfere with ambulation. Aggravating factors include wearing enclosed shoe gear. Pain is relieved with periodic professional debridement. Painful calluses are aggravated when weightbearing with and without shoegear. Pain is relieved with periodic professional debridement. ? ?Patient states he lost his wife in January. ? ?New problem(s): None.  ? ?PCP is Lawerance Cruel, MD , and last visit was May 03, 2021. ? ?Allergies  ?Allergen Reactions  ? Amlodipine Besylate Other (See Comments)  ?  Other reaction(s): HA, flushed  ? Bee Venom   ?  Other reaction(s): anaphylaxis  ? Simvastatin   ?  Other reaction(s): muscle aches ?Other reaction(s): muscle aches  ? Sulfa Antibiotics Other (See Comments)  ?  Allergic to bee sting, carries epi pen with him  ? Sulfasalazine Other (See Comments)  ?  Allergic to bee sting, carries epi pen with him  ? Itraconazole Rash and Other (See Comments)  ?  Other reaction(s): does tolerate lamisil/rash  ? Other Rash  ?  Sporinox ?Other reaction(s): anaphylaxis  ? ? ?Review of Systems: Negative except as noted in the HPI. ? ?Objective: No changes noted in today's physical examination. ? ?Constitutional Leonard Wilcox is a pleasant 88 y.o. African American male, WD, WN in NAD. AAO x 3.   ?Vascular CFT immediate b/l LE. Palpable DP/PT pulses b/l LE. Digital hair sparse b/l. Skin temperature gradient WNL b/l. No pain with calf compression b/l. No edema noted b/l. No cyanosis or clubbing noted b/l LE.  ?Neurologic Normal speech. Oriented to person, place, and time. Protective sensation intact 5/5 intact bilaterally with 10g monofilament b/l. Vibratory sensation intact b/l.  ?Dermatologic Pedal integument with normal turgor, texture and tone b/l LE. No open  wounds b/l. No interdigital macerations b/l. Toenails 1-5 b/l elongated, thickened, discolored with subungual debris. +Tenderness with dorsal palpation of nailplates. Hyperkeratotic lesion(s) noted posterior aspect of heel right foot.  ?Orthopedic: Muscle strength 5/5 to all lower extremity muscle groups bilaterally. Patient ambulates independent of any assistive aids.  ? ?Radiographs: None ? ?Assessment/Plan: ?1. Pain due to onychomycosis of toenails of both feet   ?2. Callus of foot   ?3. Right foot pain   ?  ?-Extended condolences to Mr. Martenson on the loss of his wife. ?-Examined patient. ?-Medicare ABN on file for paring of callus. ?-Toenails 1-5 b/l were debrided in length and girth with sterile nail nippers and dremel without iatrogenic bleeding.  ?-Callus(es) right foot pared utilizing sterile scalpel blade without complication or incident. Total number debrided =1. ?-Patient/POA to call should there be question/concern in the interim.  ? ?Return in about 3 months (around 11/09/2021). ? ?Marzetta Board, DPM  ?

## 2021-08-24 ENCOUNTER — Other Ambulatory Visit: Payer: Self-pay | Admitting: *Deleted

## 2021-08-24 NOTE — Patient Outreach (Signed)
[facility]) Care Management ? ?08/24/2021 ? ?Feliciano L Rhinesmith ?01-26-1931 ?[id number] ? ?Nurse spoke with patient to inform them that their PCP is not participating with [facility] services any longer. Nurse did discuss that their PCP's office now had their own RN CM what will follow up with them in the future. Patient verbalized understanding.  ? ?Plan: ?RN Health Coach will close case and send patient a case closed letter.  ? ?Emelia Loron RN, BSN ?[facility] Care Management  ?RN Health Coach ?[phone] ?[email or web address] ? ? ?

## 2021-08-29 ENCOUNTER — Encounter: Payer: Self-pay | Admitting: Internal Medicine

## 2021-08-31 NOTE — Telephone Encounter (Signed)
Cut back on HCTZ to 12.5 mg    ?Keep following BP     ? ?

## 2021-09-01 MED ORDER — HYDROCHLOROTHIAZIDE 12.5 MG PO CAPS: 12.5000 mg | ORAL_CAPSULE | Freq: Every day | ORAL | 3 refills | Status: DC

## 2021-09-01 NOTE — Telephone Encounter (Signed)
Called pt reviewed MD recommendations.  Pt is agreeable to plan.  Pt will cut 25 mg HCTZ tablets in 1/2 does not need script filled at this time.  Note sent to pharmacy with this information.  Advised pt to keep record of BP readings.  Pt had no questions or concerns.   ?

## 2021-09-18 ENCOUNTER — Other Ambulatory Visit: Payer: Self-pay | Admitting: Internal Medicine

## 2021-09-27 DIAGNOSIS — D1721 Benign lipomatous neoplasm of skin and subcutaneous tissue of right arm: Secondary | ICD-10-CM | POA: Diagnosis not present

## 2021-10-30 DIAGNOSIS — Z0000 Encounter for general adult medical examination without abnormal findings: Secondary | ICD-10-CM | POA: Diagnosis not present

## 2021-11-14 ENCOUNTER — Ambulatory Visit: Payer: Medicare Other | Admitting: Podiatry

## 2021-11-14 ENCOUNTER — Encounter: Payer: Self-pay | Admitting: Podiatry

## 2021-11-14 DIAGNOSIS — L84 Corns and callosities: Secondary | ICD-10-CM | POA: Diagnosis not present

## 2021-11-14 DIAGNOSIS — M79674 Pain in right toe(s): Secondary | ICD-10-CM | POA: Diagnosis not present

## 2021-11-14 DIAGNOSIS — H3530 Unspecified macular degeneration: Secondary | ICD-10-CM | POA: Diagnosis not present

## 2021-11-14 DIAGNOSIS — B351 Tinea unguium: Secondary | ICD-10-CM | POA: Diagnosis not present

## 2021-11-14 DIAGNOSIS — G629 Polyneuropathy, unspecified: Secondary | ICD-10-CM | POA: Diagnosis not present

## 2021-11-14 DIAGNOSIS — Z6820 Body mass index (BMI) 20.0-20.9, adult: Secondary | ICD-10-CM | POA: Diagnosis not present

## 2021-11-14 DIAGNOSIS — M353 Polymyalgia rheumatica: Secondary | ICD-10-CM | POA: Diagnosis not present

## 2021-11-14 DIAGNOSIS — M79675 Pain in left toe(s): Secondary | ICD-10-CM | POA: Diagnosis not present

## 2021-11-14 DIAGNOSIS — Z79899 Other long term (current) drug therapy: Secondary | ICD-10-CM | POA: Diagnosis not present

## 2021-11-14 DIAGNOSIS — M2550 Pain in unspecified joint: Secondary | ICD-10-CM | POA: Insufficient documentation

## 2021-11-14 DIAGNOSIS — M0609 Rheumatoid arthritis without rheumatoid factor, multiple sites: Secondary | ICD-10-CM | POA: Diagnosis not present

## 2021-11-14 NOTE — Patient Instructions (Signed)
Peripheral Neuropathy Peripheral neuropathy is a type of nerve damage. It affects nerves that carry signals between the spinal cord and the arms, legs, and the rest of the body (peripheral nerves). It does not affect nerves in the spinal cord or brain. In peripheral neuropathy, one nerve or a group of nerves may be damaged. Peripheral neuropathy is a broad category that includes many specific nerve disorders, like diabetic neuropathy, hereditary neuropathy, and carpal tunnel syndrome. What are the causes? This condition may be caused by: Certain diseases, such as: Diabetes. This is the most common cause of peripheral neuropathy. Autoimmune diseases, such as rheumatoid arthritis and systemic lupus erythematosus. Nerve diseases that are passed from parent to child (inherited). Kidney disease. Thyroid disease. Other causes may include: Nerve injury. Pressure or stress on a nerve that lasts a long time. Lack (deficiency) of B vitamins. This can result from alcoholism, poor diet, or a restricted diet. Infections. Some medicines, such as cancer medicines (chemotherapy). Poisonous (toxic) substances, such as lead and mercury. Too little blood flowing to the legs. In some cases, the cause of this condition is not known. What are the signs or symptoms? Symptoms of this condition depend on which of your nerves is damaged. Symptoms in the legs, hands, and arms can include: Loss of feeling (numbness) in the feet, hands, or both. Tingling in the feet, hands, or both. Burning pain. Very sensitive skin. Weakness. Not being able to move a part of the body (paralysis). Clumsiness or poor coordination. Muscle twitching. Loss of balance. Symptoms in other parts of the body can include: Not being able to control your bladder. Feeling dizzy. Sexual problems. How is this diagnosed? Diagnosing and finding the cause of peripheral neuropathy can be difficult. Your health care provider will take your  medical history and do a physical exam. A neurological exam will also be done. This involves checking things that are affected by your brain, spinal cord, and nerves (nervous system). For example, your health care provider will check your reflexes, how you move, and what you can feel. You may have other tests, such as: Blood tests. Electromyogram (EMG) and nerve conduction tests. These tests check nerve function and how well the nerves are controlling the muscles. Imaging tests, such as a CT scan or MRI, to rule out other causes of your symptoms. Removing a small piece of nerve to be examined in a lab (nerve biopsy). Removing and examining a small amount of the fluid that surrounds the brain and spinal cord (lumbar puncture). How is this treated? Treatment for this condition may involve: Treating the underlying cause of the neuropathy, such as diabetes, kidney disease, or vitamin deficiencies. Stopping medicines that can cause neuropathy, such as chemotherapy. Medicine to help relieve pain. Medicines may include: Prescription or over-the-counter pain medicine. Anti-seizure medicine. Antidepressants. Pain-relieving patches that are applied to painful areas of skin. Surgery to relieve pressure on a nerve or to destroy a nerve that is causing pain. Physical therapy to help improve movement and balance. Devices to help you move around (assistive devices). Follow these instructions at home: Medicines Take over-the-counter and prescription medicines only as told by your health care provider. Do not take any other medicines without first asking your health care provider. Ask your health care provider if the medicine prescribed to you requires you to avoid driving or using machinery. Lifestyle  Do not use any products that contain nicotine or tobacco. These products include cigarettes, chewing tobacco, and vaping devices, such as e-cigarettes. Smoking keeps   blood from reaching damaged nerves. If you  need help quitting, ask your health care provider. Avoid or limit alcohol. Too much alcohol can cause a vitamin B deficiency, and vitamin B is needed for healthy nerves. Eat a healthy diet. This includes: Eating foods that are high in fiber, such as beans, whole grains, and fresh fruits and vegetables. Limiting foods that are high in fat and processed sugars, such as fried or sweet foods. General instructions  If you have diabetes, work closely with your health care provider to keep your blood sugar under control. If you have numbness in your feet: Check every day for signs of injury or infection. Watch for redness, warmth, and swelling. Wear padded socks and comfortable shoes. These help protect your feet. Develop a good support system. Living with peripheral neuropathy can be stressful. Consider talking with a mental health specialist or joining a support group. Use assistive devices and attend physical therapy as told by your health care provider. This may include using a walker or a cane. Keep all follow-up visits. This is important. Where to find more information National Institute of Neurological Disorders: www.ninds.nih.gov Contact a health care provider if: You have new signs or symptoms of peripheral neuropathy. You are struggling emotionally from dealing with peripheral neuropathy. Your pain is not well controlled. Get help right away if: You have an injury or infection that is not healing normally. You develop new weakness in an arm or leg. You have fallen or do so frequently. Summary Peripheral neuropathy is when the nerves in the arms or legs are damaged, resulting in numbness, weakness, or pain. There are many causes of peripheral neuropathy, including diabetes, pinched nerves, vitamin deficiencies, autoimmune disease, and hereditary conditions. Diagnosing and finding the cause of peripheral neuropathy can be difficult. Your health care provider will take your medical  history, do a physical exam, and do tests, including blood tests and nerve function tests. Treatment involves treating the underlying cause of the neuropathy and taking medicines to help control pain. Physical therapy and assistive devices may also help. This information is not intended to replace advice given to you by your health care provider. Make sure you discuss any questions you have with your health care provider. Document Revised: 01/17/2021 Document Reviewed: 01/17/2021 Elsevier Patient Education  2023 Elsevier Inc.  

## 2021-12-06 DIAGNOSIS — I10 Essential (primary) hypertension: Secondary | ICD-10-CM | POA: Diagnosis not present

## 2021-12-06 DIAGNOSIS — E7800 Pure hypercholesterolemia, unspecified: Secondary | ICD-10-CM | POA: Diagnosis not present

## 2021-12-12 DIAGNOSIS — Z0000 Encounter for general adult medical examination without abnormal findings: Secondary | ICD-10-CM | POA: Diagnosis not present

## 2022-02-14 ENCOUNTER — Encounter: Payer: Self-pay | Admitting: Podiatry

## 2022-02-14 ENCOUNTER — Ambulatory Visit: Payer: Medicare Other | Admitting: Podiatry

## 2022-02-14 DIAGNOSIS — L84 Corns and callosities: Secondary | ICD-10-CM

## 2022-02-14 DIAGNOSIS — M79675 Pain in left toe(s): Secondary | ICD-10-CM

## 2022-02-14 DIAGNOSIS — B351 Tinea unguium: Secondary | ICD-10-CM

## 2022-02-14 DIAGNOSIS — M79674 Pain in right toe(s): Secondary | ICD-10-CM

## 2022-02-14 DIAGNOSIS — H3530 Unspecified macular degeneration: Secondary | ICD-10-CM | POA: Insufficient documentation

## 2022-02-15 ENCOUNTER — Ambulatory Visit: Payer: Medicare Other | Admitting: Podiatry

## 2022-02-19 NOTE — Progress Notes (Signed)
  Subjective:  Patient ID: Leonard Wilcox, male    DOB: 10-Jun-1930,  MRN: [id number]  [place] presents to clinic today for callus(es) left lower extremity and painful thick toenails that are difficult to trim. Painful toenails interfere with ambulation. Aggravating factors include wearing enclosed shoe gear. Pain is relieved with periodic professional debridement. Painful calluses are aggravated when weightbearing with and without shoegear. Pain is relieved with periodic professional debridement.  New problem(s): None.   PCP is Lawerance Cruel, MD , and last visit was  October 30, 2021.  Allergies  Allergen Reactions   Amlodipine Besylate Other (See Comments)    Other reaction(s): HA, flushed   Bee Venom     Other reaction(s): anaphylaxis   Simvastatin     Other reaction(s): muscle aches Other reaction(s): muscle aches   Sulfa Antibiotics Other (See Comments)    Allergic to bee sting, carries epi pen with him   Sulfasalazine Other (See Comments)    Allergic to bee sting, carries epi pen with him   Itraconazole Rash and Other (See Comments)    Other reaction(s): does tolerate lamisil/rash   Other Rash    Sporinox Other reaction(s): anaphylaxis    Review of Systems: Negative except as noted in the HPI.  Objective: No changes noted in today's physical examination. Leonard Wilcox is a pleasant 88 y.o. male in NAD. AAO x 3.  Assessment/Plan: 1. Pain due to onychomycosis of toenails of both feet   2. Callus of foot   3. Pain in left toe(s)     -Consent given for treatment as described below: -Examined patient. -Medicare ABN signed. Patient consents for services of paring of callus(es)  today. Copy has been placed in patient's chart. -Toenails 1-5 b/l were debrided in length and girth with sterile nail nippers and dremel without iatrogenic bleeding.  -Callus(es) distal tip of left great toe pared utilizing sterile scalpel blade without complication or incident. Total  number debrided =1. -Patient/POA to call should there be question/concern in the interim.   Return in about 3 months (around 05/16/2022).  Marzetta Board, DPM

## 2022-03-08 DIAGNOSIS — Z9889 Other specified postprocedural states: Secondary | ICD-10-CM | POA: Diagnosis not present

## 2022-03-08 DIAGNOSIS — Z923 Personal history of irradiation: Secondary | ICD-10-CM | POA: Diagnosis not present

## 2022-03-08 DIAGNOSIS — H90A21 Sensorineural hearing loss, unilateral, right ear, with restricted hearing on the contralateral side: Secondary | ICD-10-CM | POA: Diagnosis not present

## 2022-03-08 DIAGNOSIS — H353131 Nonexudative age-related macular degeneration, bilateral, early dry stage: Secondary | ICD-10-CM | POA: Diagnosis not present

## 2022-03-08 DIAGNOSIS — R531 Weakness: Secondary | ICD-10-CM | POA: Diagnosis not present

## 2022-03-08 DIAGNOSIS — H3530 Unspecified macular degeneration: Secondary | ICD-10-CM | POA: Diagnosis not present

## 2022-03-08 DIAGNOSIS — H903 Sensorineural hearing loss, bilateral: Secondary | ICD-10-CM | POA: Diagnosis not present

## 2022-03-08 DIAGNOSIS — D333 Benign neoplasm of cranial nerves: Secondary | ICD-10-CM | POA: Diagnosis not present

## 2022-03-08 DIAGNOSIS — R2689 Other abnormalities of gait and mobility: Secondary | ICD-10-CM | POA: Diagnosis not present

## 2022-05-08 DIAGNOSIS — H26491 Other secondary cataract, right eye: Secondary | ICD-10-CM | POA: Diagnosis not present

## 2022-05-08 DIAGNOSIS — Z79899 Other long term (current) drug therapy: Secondary | ICD-10-CM | POA: Diagnosis not present

## 2022-05-08 DIAGNOSIS — H353133 Nonexudative age-related macular degeneration, bilateral, advanced atrophic without subfoveal involvement: Secondary | ICD-10-CM | POA: Diagnosis not present

## 2022-05-08 DIAGNOSIS — H524 Presbyopia: Secondary | ICD-10-CM | POA: Diagnosis not present

## 2022-05-08 DIAGNOSIS — H5203 Hypermetropia, bilateral: Secondary | ICD-10-CM | POA: Diagnosis not present

## 2022-05-08 DIAGNOSIS — H52203 Unspecified astigmatism, bilateral: Secondary | ICD-10-CM | POA: Diagnosis not present

## 2022-05-14 DIAGNOSIS — I889 Nonspecific lymphadenitis, unspecified: Secondary | ICD-10-CM | POA: Diagnosis not present

## 2022-05-22 DIAGNOSIS — I889 Nonspecific lymphadenitis, unspecified: Secondary | ICD-10-CM | POA: Diagnosis not present

## 2022-05-24 DIAGNOSIS — M353 Polymyalgia rheumatica: Secondary | ICD-10-CM | POA: Diagnosis not present

## 2022-05-24 DIAGNOSIS — Z79899 Other long term (current) drug therapy: Secondary | ICD-10-CM | POA: Diagnosis not present

## 2022-05-24 DIAGNOSIS — M0609 Rheumatoid arthritis without rheumatoid factor, multiple sites: Secondary | ICD-10-CM | POA: Diagnosis not present

## 2022-05-29 NOTE — Progress Notes (Unsigned)
Cardiology Office Note   Date:  05/31/2022   ID:  HANSEN CARINO, DOB Apr 26, 1931, MRN [id number]  PCP:  Lawerance Cruel, MD  Cardiologist:   Dorris Carnes, MD   Pt returns for f/u of LE edema     History of Present Illness: Leonard Wilcox is a 89 y.o. male  with hx of  SVT and LE edema     The [place] was [place] in [place]  Since seen the patient deneis CP   Notes rare dizziness with quick stand.   Breathing is OK The patient notes some problems with balance  Hx of scoliiosis  Has not fallen    Pt also says Dr Trudie Reed check liver functino tests   Not normal No palpitations       Outpatient Medications Prior to Visit  Medication Sig Dispense Refill   Ascorbic Acid (VITAMIN C) 500 MG CAPS 1 tablet     B-Complex TABS      diclofenac Sodium (VOLTAREN) 1 % GEL Apply topically.     EPINEPHrine 0.3 mg/0.3 mL IJ SOAJ injection See admin instructions.     furosemide (LASIX) 20 MG tablet Take 1 tablet (20 mg total) by mouth daily. 90 tablet 3   hydrochlorothiazide (MICROZIDE) 12.5 MG capsule Take 1 capsule (12.5 mg total) by mouth daily. 90 capsule 3   hydroxychloroquine (PLAQUENIL) 200 MG tablet Take 200 mg by mouth 2 (two) times daily.     loratadine (CLARITIN) 10 MG tablet 1 tablet Orally Once a day     Magnesium 250 MG TABS 1 tablet     Multiple Vitamin (MULTIVITAMIN ADULT) TABS Orally     Multiple Vitamins-Minerals (PRESERVISION AREDS 2) CAPS Take 2 capsules by mouth daily in the afternoon.     Omega-3 Fatty Acids (FISH OIL) 1000 MG CAPS 1 capsule with a meal     ramipril (ALTACE) 10 MG capsule 1 capsule     aspirin EC 81 MG tablet Take 81 mg by mouth daily. (Patient not taking: Reported on 08/08/2021)     Calcium Carbonate-Vit D-Min (CALCIUM 1200) 1200-1000 MG-UNIT CHEW 1 tablet with a meal Orally Once a day (Patient not taking: Reported on 05/31/2022)     Cyanocobalamin 2000 MCG TBCR Take by mouth. (Patient not taking: Reported on 05/31/2022)     Magnesium Oxide 400 MG CAPS  1 tablet (Patient not taking: Reported on 05/31/2022)     sildenafil (REVATIO) 20 MG tablet Take 20 mg by mouth 3 (three) times daily. (Patient not taking: Reported on 08/08/2021)     No facility-administered medications prior to visit.     Allergies:   Amlodipine besylate, Bee venom, Simvastatin, Sulfa antibiotics, Sulfasalazine, Itraconazole, and Other   Past Medical History:  Diagnosis Date   Arthritis     Past Surgical History:  Procedure Laterality Date   BRAIN SURGERY     EYE SURGERY       Social History:  The patient  reports that he has never smoked. He has never used smokeless tobacco. He reports that he does not drink alcohol and does not use drugs.   Family History:  The patient's family history includes Dementia in his mother; Stomach cancer in his father.    ROS:  Please see the history of present illness. All other systems are reviewed and  Negative to the above problem except as noted.    PHYSICAL EXAM: VS:  BP (!) 136/90   Pulse 85   Ht 6'  2" (1.88 m)   Wt 165 lb 9.6 oz (75.1 kg)   BMI 21.26 kg/m   BP on my check 152/92   GEN: Well nourished, well developed, in no acute distress  HEENT: normal  Neck: no JVD, carotid bruits Cardiac: RRR; no murmurs., trivial LE  edema  Respiratory:  clear to auscultation bilaterally, GI: soft, nontender, nondistended, + BS  No hepatomegaly  MS: no deformity Moving all extremities   Skin: warm and dry, no rash Neuro:  Strength and sensation are intact Psych: euthymic mood, full affect   EKG:  EKG is done SR 85  LVH   Lipid Panel No results found for: "CHOL", "TRIG", "HDL", "CHOLHDL", "VLDL", "LDLCALC", "LDLDIRECT"    Wt Readings from Last 3 Encounters:  05/31/22 165 lb 9.6 oz (75.1 kg)  08/08/21 157 lb 12.8 oz (71.6 kg)  05/03/21 154 lb 6.4 oz (70 kg)      ASSESSMENT AND PLAN:  1  HTN  BP eleated  Recomm   Stop HCTZ   Keep on furosemide   Add carvedilol 3.125  (did not toelrate amlodipine in past)     2   Hx SVT  No symptoms   3  Unsteadiness   will referr to balance rehab  4  LE edema   No significant edema       5  Abnormal LFts   Will get labs from Dr Trudie Reed office   Follow up in 4 ti 6 wks  for BP check Current medicines are reviewed at length with the patient today.  The patient does not have concerns regarding medicines.    Signed, Dorris Carnes, MD  05/31/2022 10:31 AM    [facility] [place], [place], [place]  [address] Phone: [phone]; Fax: [phone]

## 2022-05-31 ENCOUNTER — Ambulatory Visit: Payer: Medicare Other | Attending: Internal Medicine | Admitting: Internal Medicine

## 2022-05-31 ENCOUNTER — Encounter: Payer: Self-pay | Admitting: Internal Medicine

## 2022-05-31 VITALS — BP 136/90 | HR 85 | Ht 74.0 in | Wt 165.6 lb

## 2022-05-31 DIAGNOSIS — R609 Edema, unspecified: Secondary | ICD-10-CM

## 2022-05-31 DIAGNOSIS — R2681 Unsteadiness on feet: Secondary | ICD-10-CM

## 2022-05-31 DIAGNOSIS — I10 Essential (primary) hypertension: Secondary | ICD-10-CM | POA: Diagnosis not present

## 2022-05-31 DIAGNOSIS — Z79899 Other long term (current) drug therapy: Secondary | ICD-10-CM

## 2022-05-31 DIAGNOSIS — R600 Localized edema: Secondary | ICD-10-CM

## 2022-05-31 MED ORDER — CARVEDILOL 3.125 MG PO TABS: 3.1250 mg | ORAL_TABLET | Freq: Two times a day (BID) | ORAL | 3 refills | Status: DC

## 2022-05-31 NOTE — Patient Instructions (Addendum)
Medication Instructions: STOP HCTZ START COREG (CARVEDILOL) 3.125 MG TWICE A DAY   *If you need a refill on your cardiac medications before your next appointment, please call your pharmacy*   Lab Work:  If you have labs (blood work) drawn today and your tests are completely normal, you will receive your results only by: [place] (if you have MyChart) OR A paper copy in the mail If you have any lab test that is abnormal or we need to change your treatment, we will call you to review the results.   Testing/Procedures:    Follow-Up: At [facility], you and your health needs are our priority.  As part of our continuing mission to provide you with exceptional heart care, we have created designated Provider Care Teams.  These Care Teams include your primary Cardiologist (physician) and Advanced Practice Providers (APPs -  Physician Assistants and Nurse Practitioners) who all work together to provide you with the care you need, when you need it.  We recommend signing up for the patient portal called "MyChart".  Sign up information is provided on this After Visit Summary.  MyChart is used to connect with patients for Virtual Visits (Telemedicine).  Patients are able to view lab/test results, encounter notes, upcoming appointments, etc.  Non-urgent messages can be sent to your provider as well.   To learn more about what you can do with MyChart, go to [email or web address].    Your next appointment:   4-6  week(s)  The format for your next appointment:   In Person  Provider:   DR PAULA ROSS   Other Instructions   VESTIBULAR DIZZINESS FOR UNSTEADY GAIT    Important Information About Sugar

## 2022-06-04 ENCOUNTER — Ambulatory Visit: Payer: Medicare Other | Admitting: Podiatry

## 2022-06-04 VITALS — BP 139/87

## 2022-06-04 DIAGNOSIS — M79674 Pain in right toe(s): Secondary | ICD-10-CM

## 2022-06-04 DIAGNOSIS — B351 Tinea unguium: Secondary | ICD-10-CM | POA: Diagnosis not present

## 2022-06-04 DIAGNOSIS — M79675 Pain in left toe(s): Secondary | ICD-10-CM

## 2022-06-04 DIAGNOSIS — L84 Corns and callosities: Secondary | ICD-10-CM

## 2022-06-04 NOTE — Progress Notes (Unsigned)
  Subjective:  Patient ID: Leonard Wilcox, male    DOB: [birth year],  MRN: [id number]  [place] presents to clinic today for {jgcomplaint:23593}  Chief Complaint  Patient presents with   Nail Problem    [facility] PCP-Ross,Alan PCP [facility]-Over an year ago   New problem(s): None. {jgcomplaint:23593}  PCP is Lawerance Cruel, MD.  Allergies  Allergen Reactions   Amlodipine Besylate Other (See Comments)    Other reaction(s): HA, flushed   Bee Venom     Other reaction(s): anaphylaxis   Simvastatin     Other reaction(s): muscle aches Other reaction(s): muscle aches   Sulfa Antibiotics Other (See Comments)    Allergic to bee sting, carries epi pen with him   Sulfasalazine Other (See Comments)    Allergic to bee sting, carries epi pen with him   Itraconazole Rash and Other (See Comments)    Other reaction(s): does tolerate lamisil/rash   Other Rash    Sporinox Other reaction(s): anaphylaxis    Review of Systems: Negative except as noted in the HPI.  Objective: No changes noted in today's physical examination. There were no vitals filed for this visit. Leonard Wilcox is a pleasant 89 y.o. male WD, WN in NAD. AAO x 3. Vascular CFT immediate b/l LE. Palpable DP/PT pulses b/l LE. Digital hair sparse b/l. Skin temperature gradient WNL b/l. No pain with calf compression b/l. No edema noted b/l. No cyanosis or clubbing noted b/l LE.  Neurologic Normal speech. Oriented to person, place, and time. Pt has subjective symptoms of neuropathy. Protective sensation intact 5/5 intact bilaterally with 10g monofilament b/l. Vibratory sensation intact b/l.  Dermatologic Pedal integument with normal turgor, texture and tone b/l LE. No open wounds b/l. No interdigital macerations b/l. Toenails 1-5 b/l elongated, thickened, discolored with subungual debris. +Tenderness with dorsal palpation of nailplates. Hyperkeratotic lesion(s) noted posterior aspect of heel right foot.  Orthopedic: Muscle  strength 5/5 to all lower extremity muscle groups bilaterally. Patient ambulates independent of any assistive aids.   Radiographs: None Assessment/Plan: 1. Pain due to onychomycosis of toenails of both feet   2. Callus of foot     No orders of the defined types were placed in this encounter.   None {Jgplan:23602::"-Patient/POA to call should there be question/concern in the interim."}   Return in about 3 months (around 09/03/2022).  Marzetta Board, DPM

## 2022-06-05 ENCOUNTER — Telehealth: Payer: Self-pay

## 2022-06-05 NOTE — Telephone Encounter (Signed)
[facility] called to advise [place] that the pt has not had a Hepatic Panel with them for quite some time and plans to have one drawn on 06/25/22 and they will fax to Dr Harrington Challenger for her review when the results come back.

## 2022-06-06 ENCOUNTER — Ambulatory Visit: Payer: Medicare Other | Attending: Internal Medicine

## 2022-06-06 DIAGNOSIS — R262 Difficulty in walking, not elsewhere classified: Secondary | ICD-10-CM | POA: Diagnosis not present

## 2022-06-06 DIAGNOSIS — R2681 Unsteadiness on feet: Secondary | ICD-10-CM | POA: Diagnosis not present

## 2022-06-06 NOTE — Therapy (Signed)
OUTPATIENT PHYSICAL THERAPY VESTIBULAR EVALUATION     Patient Name: Leonard Wilcox MRN: [id number] DOB:01-26-1931, 89 y.o., male Today's Date: 06/06/2022  END OF SESSION:  PT End of Session - 06/06/22 1055     Visit Number 1    Number of Visits 1    Authorization Type [employer] medicare    PT Start Time 1058    PT Stop Time 1141    PT Time Calculation (min) 43 min    Activity Tolerance Patient tolerated treatment well    Behavior During Therapy WFL for tasks assessed/performed             Past Medical History:  Diagnosis Date   Arthritis    Past Surgical History:  Procedure Laterality Date   BRAIN SURGERY     EYE SURGERY     Patient Active Problem List   Diagnosis Date Noted   Macular degeneration 02/14/2022   Joint pain 11/14/2021   Other long term (current) drug therapy 11/14/2021   Polymyalgia rheumatica ([place]) 11/14/2021   Acute stress disorder 04/28/2021   Scoliosis 04/28/2021   Skin sensation disturbance 10/20/2020   Allergic reaction to bee sting 07/05/2020   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[place]) 07/05/2020   ED (erectile dysfunction) of organic origin 07/05/2020   Essential hypertension 07/05/2020   Hearing loss 07/05/2020   High cholesterol 07/05/2020   Osteopenia 07/05/2020   Peripheral neuropathy 07/05/2020   Polymyalgia ([place]) 07/05/2020   Rheumatoid arthritis ([place]) 07/05/2020   Sinus tachycardia 07/05/2020   Asymmetrical sensorineural hearing loss 02/05/2020   Imbalance 02/05/2020   Sensorineural hearing loss (SNHL) of right ear with restricted hearing of left ear 02/05/2020   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   Bladder neck obstruction 10/25/2017   Benign prostatic hyperplasia 09/22/2017   Dysuria 09/22/2017   Combined forms of age-related cataract of right eye 07/26/2016   Ongoing use of possibly toxic medication 07/26/2016   Early stage nonexudative age-related macular degeneration of both eyes 07/21/2015   Status post  intraocular lens implant 06/25/2013   Epiretinal membrane, left 06/21/2011   [place] retinal tear of right eye 06/21/2011   Nuclear cataract 06/21/2011   Presence of intraocular lens 06/21/2011   Retinal detachment of left eye with multiple breaks 06/21/2011    PCP: Lawerance Cruel, MD REFERRING PROVIDER: Fay Records, MD  REFERRING DIAG: R26.81 (ICD-10-CM) - Unsteady gait  THERAPY DIAG:  Unsteadiness on feet  Difficulty in walking, not elsewhere classified  ONSET DATE: 05/31/2022  Rationale for Evaluation and Treatment: Rehabilitation  SUBJECTIVE:   SUBJECTIVE STATEMENT: Patient arrives to clinic alone and without AD. He reports that his cardiologist sent him here for balance problems. States he has scoliosis and feels as though his gait impairment is due to his L leg being harder to lift than the R. Mentions his R vestibular schwannoma that was "killed" 2-3 years ago and states he has no remaining deficits from this.  Does attend silver sneakers 3x a week. At end of session, patient reporting that he previously had a bout of PT for his balance and currently does exercises for that and does not feel as though he would benefit from another bout of PT at this time.  Pt accompanied by: self   PERTINENT HISTORY: unilateral vestibular schwannoma, HLD, HTN, long standing scoliosis   PAIN:  Are you having pain? No  PRECAUTIONS: Fall  WEIGHT BEARING RESTRICTIONS: No  FALLS: Has patient fallen in last 6 months? No  LIVING ENVIRONMENT: Lives  with: lives alone Lives in: House/apartment Stairs: Yes: External: 3 steps; on left going up Has following equipment at home: shower chair and Grab bars  PLOF: Independent driving  PATIENT GOALS: "improve my balance a little bit"  OBJECTIVE:   COGNITION: Overall cognitive status: Within functional limits for tasks assessed   SENSATION: Reports neuropathy in distal B LE   POSTURE:  No Significant postural  limitations   STRENGTH: WFL  BED MOBILITY:  Reports no difficulty  TRANSFERS: Assistive device utilized: None  Sit to stand: Complete Independence Stand to sit: Complete Independence Chair to chair: Complete Independence  RAMP: SBA  CURB: SBA  GAIT: Gait pattern: step through pattern, decreased arm swing- Right, decreased arm swing- Left, Right foot flat, Left foot flat, knee flexed in stance- Right, knee flexed in stance- Left, lateral lean- Right, trunk rotated posterior- Right, narrow BOS, and poor foot clearance- Left Distance walked: clinic Assistive device utilized: None Level of assistance: Complete Independence  FUNCTIONAL TESTS:   [facility] PT Assessment - 06/06/22 0001       Standardized Balance Assessment   Standardized Balance Assessment Berg Balance Test    Five times sit to stand comments  21.81   using B UE   10 Meter Walk .34ms      Berg Balance Test   Sit to Stand Able to stand without using hands and stabilize independently    Standing Unsupported Able to stand safely 2 minutes    Sitting with Back Unsupported but Feet Supported on Floor or Stool Able to sit safely and securely 2 minutes    Stand to Sit Controls descent by using hands    Transfers Able to transfer safely, definite need of hands    Standing Unsupported with Eyes Closed Able to stand 10 seconds with supervision    Standing Unsupported with Feet Together Able to place feet together independently and stand for 1 minute with supervision    From Standing, Reach Forward with Outstretched Arm Can reach forward >5 cm safely (2")    From Standing Position, Pick up Object from Floor Able to pick up shoe, needs supervision    From Standing Position, Turn to Look Behind Over each Shoulder Looks behind one side only/other side shows less weight shift    Turn 360 Degrees Able to turn 360 degrees safely but slowly    Standing Unsupported, Alternately Place Feet on Step/Stool Able to complete >2 steps/needs  minimal assist    Standing Unsupported, One Foot in Front Able to take small step independently and hold 30 seconds    Standing on One Leg Tries to lift leg/unable to hold 3 seconds but remains standing independently    Total Score 38      Timed Up and Go Test   Normal TUG (seconds) 13.9              PATIENT SURVEYS:  FOTO 62 (expected to be at 62)  VESTIBULAR ASSESSMENT:  GENERAL OBSERVATION: NAD   Patient with primary issue of balance, not necessary vestibular in origin    BP: Seated: 179/115; 2' rest: 189/116, manually: 164/94    VESTIBULAR TREATMENT:  N/A eval   PATIENT EDUCATION: Education details: PT POC, exam findings, BP parameters Person educated: Patient Education method: Explanation Education comprehension: verbalized understanding  HOME EXERCISE PROGRAM: Patient has from previous bout of therapy  GOALS: Not needed as patient does not wish to participate in PT at this time  ASSESSMENT:  CLINICAL IMPRESSION: Patient is a 89 y.o. male who was seen today for physical therapy evaluation and treatment for balance impairment. He does not wish to participate in physical therapy at this time, despite a majority of his outcome measures indicating that he is at an increased risk for falling. Discussed with patient and he wishes to continue exercises on his own at home without skilled PT intervention. Patient demonstrated increased fall risk noted by score of 38/56 on the [facility] Scale.  <45/56 = fall risk, <42/56 = predictive of recurrent falls, <40/56 = 100% fall risk  >41 = independent, 21-40 = assistive device, 0-20 = wheelchair level  MDC 6.9 (4 pts 45-56, 5 pts 35-44, 7 pts 25-34) (ANPTA Core Set of Outcome Measures for Adults with Neurologic Conditions, 2018). 10 Meter Walk Test: Patient instructed to walk 10 meters (32.8 ft) as quickly and as safely as  possible at their normal speed x2 and at a fast speed x2. Time measured from 2 meter mark to 8 meter mark to accommodate ramp-up and ramp-down.  Normal speed: 0.61ms Cut off scores: <0.4 m/s = household Ambulator, 0.4-0.8 m/s = limited community Ambulator, >0.8 m/s = community Ambulator, >1.2 m/s = crossing a street, <1.0 = increased fall risk MCID 0.05 m/s (small), 0.13 m/s (moderate), 0.06 m/s (significant)  (ANPTA Core Set of Outcome Measures for Adults with Neurologic Conditions, 2018). Patient completed the Timed Up and Go test (TUG) in 13.9 seconds.  Geriatrics: need for further assessment of fall risk: ? 12 sec; Recurrent falls: > 15 sec; Vestibular Disorders fall risk: > 15 sec; Parkinson's Disease fall risk: > 16 sec ([email or web address] 2023). Five times Sit to Stand Test (FTSS) Method: Use a straight back chair with a solid seat that is 17-18" high. Ask participant to sit on the chair with arms folded across their chest.   Instructions: "Stand up and sit down as quickly as possible 5 times, keeping your arms folded across your chest."   Measurement: Stop timing when the participant touches the chair in sitting the 5th time.  TIME: 21.81 sec  Cut off scores indicative of increased fall risk: >12 sec CVA, >16 sec PD, >13 sec vestibular (ANPTA Core Set of Outcome Measures for Adults with Neurologic Conditions, 2018). Despite patient being at an increased risk for falling, he does not wish to participate in PT at this time.    CLINICAL DECISION MAKING: Stable/uncomplicated  EVALUATION COMPLEXITY: Low   PLAN:  PT FREQUENCY: one time visit  PT DURATION: other: 1 time visit   JDebbora Dus PT JDebbora Dus PT, DPT, CBIS  06/06/2022, 11:47 AM

## 2022-06-07 ENCOUNTER — Encounter: Payer: Self-pay | Admitting: Podiatry

## 2022-06-12 DIAGNOSIS — H26491 Other secondary cataract, right eye: Secondary | ICD-10-CM | POA: Diagnosis not present

## 2022-06-25 DIAGNOSIS — R945 Abnormal results of liver function studies: Secondary | ICD-10-CM | POA: Diagnosis not present

## 2022-06-29 ENCOUNTER — Ambulatory Visit: Payer: Medicare Other | Admitting: Internal Medicine

## 2022-08-02 DIAGNOSIS — H33311 Horseshoe tear of retina without detachment, right eye: Secondary | ICD-10-CM | POA: Diagnosis not present

## 2022-08-02 DIAGNOSIS — H33022 Retinal detachment with multiple breaks, left eye: Secondary | ICD-10-CM | POA: Diagnosis not present

## 2022-08-02 DIAGNOSIS — H35372 Puckering of macula, left eye: Secondary | ICD-10-CM | POA: Diagnosis not present

## 2022-08-02 DIAGNOSIS — H353132 Nonexudative age-related macular degeneration, bilateral, intermediate dry stage: Secondary | ICD-10-CM | POA: Diagnosis not present

## 2022-08-02 DIAGNOSIS — Z961 Presence of intraocular lens: Secondary | ICD-10-CM | POA: Diagnosis not present

## 2022-08-08 ENCOUNTER — Ambulatory Visit: Payer: Medicare Other | Attending: Cardiovascular Disease

## 2022-08-08 ENCOUNTER — Ambulatory Visit: Payer: Medicare Other | Admitting: Internal Medicine

## 2022-08-08 ENCOUNTER — Ambulatory Visit: Payer: Medicare Other

## 2022-08-08 VITALS — BP 140/88 | HR 82 | Wt 162.0 lb

## 2022-08-08 DIAGNOSIS — E7800 Pure hypercholesterolemia, unspecified: Secondary | ICD-10-CM | POA: Diagnosis not present

## 2022-08-08 DIAGNOSIS — Z79899 Other long term (current) drug therapy: Secondary | ICD-10-CM | POA: Diagnosis not present

## 2022-08-08 DIAGNOSIS — I10 Essential (primary) hypertension: Secondary | ICD-10-CM

## 2022-08-08 DIAGNOSIS — R609 Edema, unspecified: Secondary | ICD-10-CM | POA: Diagnosis not present

## 2022-08-08 NOTE — Patient Instructions (Signed)
Medication Instructions:   *If you need a refill on your cardiac medications before your next appointment, please call your pharmacy*   Lab Work: BMET, CBC, PRO BNP  If you have labs (blood work) drawn today and your tests are completely normal, you will receive your results only by: [place] (if you have MyChart) OR A paper copy in the mail If you have any lab test that is abnormal or we need to change your treatment, we will call you to review the results.   Testing/Procedures:    Follow-Up: At [facility], you and your health needs are our priority.  As part of our continuing mission to provide you with exceptional heart care, we have created designated Provider Care Teams.  These Care Teams include your primary Cardiologist (physician) and Advanced Practice Providers (APPs -  Physician Assistants and Nurse Practitioners) who all work together to provide you with the care you need, when you need it.  We recommend signing up for the patient portal called "MyChart".  Sign up information is provided on this After Visit Summary.  MyChart is used to connect with patients for Virtual Visits (Telemedicine).  Patients are able to view lab/test results, encounter notes, upcoming appointments, etc.  Non-urgent messages can be sent to your provider as well.   To learn more about what you can do with MyChart, go to [email or web address].    Your next appointment:   6 week(s)  Provider:   Dorris Carnes, MD     Other Instructions

## 2022-08-08 NOTE — Progress Notes (Signed)
Pt came in for a nurse visit today. He reports that he is feeling well. He has had some left leg on and off edema and it feels"heavy" but he says this is not new for him and he does not have any pain, color changes, or heat. He says he had an ultrasound prior to r/o DVT in 2022. He attributes it to his scoliosis. He denies any other edema. I assessed his legs.. he is wearing a compression stocking on the left and he says that it helps so he wears it daily. Neither leg appeared swollen to me.   He took his meds today and his BP is 142/90 and on recheck 140/88. HR 82. Pt denies any dizziness, headache, no chest pain.   He says he went to Vestibular rehab for his unsteady gait after his last OV with Dr Harrington Challenger but they advised him that he will need regular PT and when they tried to set it up for him he says he had already been through that and declined.   Pt  says he has been losing some weight... his weight last OV 05/31/2022 165 lbs Today 08/08/22 162 lbs.  He says he has been eating and drinking well and he is monitoring it at home.   He has no further complaints.   Per Dr Harrington Challenger, we will get a BMET, CBC, PRO BNP today.   I made the pt a follow up visit on about 5 weeks... 09/14/22 but will call the pt to change it after Dr Harrington Challenger reviews.

## 2022-08-09 LAB — CBC
Hematocrit: 42.6 % (ref 37.5–51.0)
Hemoglobin: 14.2 g/dL (ref 13.0–17.7)
MCH: 30.1 pg (ref 26.6–33.0)
MCHC: 33.3 g/dL (ref 31.5–35.7)
MCV: 90 fL (ref 79–97)
Platelets: 156 10*3/uL (ref 150–450)
RBC: 4.72 x10E6/uL (ref 4.14–5.80)
RDW: 12.2 % (ref 11.6–15.4)
WBC: 3.3 10*3/uL — ABNORMAL LOW (ref 3.4–10.8)

## 2022-08-09 LAB — BASIC METABOLIC PANEL
BUN/Creatinine Ratio: 28 — ABNORMAL HIGH (ref 10–24)
BUN: 27 mg/dL (ref 8–27)
CO2: 27 mmol/L (ref 20–29)
Calcium: 9.4 mg/dL (ref 8.6–10.2)
Chloride: 104 mmol/L (ref 96–106)
Creatinine, Ser: 0.98 mg/dL (ref 0.76–1.27)
Glucose: 107 mg/dL — ABNORMAL HIGH (ref 70–99)
Potassium: 4 mmol/L (ref 3.5–5.2)
Sodium: 144 mmol/L (ref 134–144)
eGFR: 74 mL/min/{1.73_m2} (ref >59)

## 2022-08-09 LAB — PRO B NATRIURETIC PEPTIDE: NT-Pro BNP: 2056 pg/mL — ABNORMAL HIGH (ref 0–486)

## 2022-08-13 ENCOUNTER — Telehealth: Payer: Self-pay

## 2022-08-13 DIAGNOSIS — R609 Edema, unspecified: Secondary | ICD-10-CM

## 2022-08-13 DIAGNOSIS — Z79899 Other long term (current) drug therapy: Secondary | ICD-10-CM

## 2022-08-13 DIAGNOSIS — R300 Dysuria: Secondary | ICD-10-CM

## 2022-08-13 DIAGNOSIS — I10 Essential (primary) hypertension: Secondary | ICD-10-CM

## 2022-08-13 MED ORDER — POTASSIUM CHLORIDE ER 10 MEQ PO TBCR: 10.0000 meq | EXTENDED_RELEASE_TABLET | Freq: Every day | ORAL | 3 refills | Status: DC

## 2022-08-13 MED ORDER — FUROSEMIDE 40 MG PO TABS: 40.0000 mg | ORAL_TABLET | Freq: Every day | ORAL | 3 refills | Status: DC

## 2022-08-13 NOTE — Telephone Encounter (Signed)
-----   Message from Dorris Carnes V, MD sent at 08/10/2022  6:51 AM EDT ----- Electrolytes and kidney function are OK CBC is overall OK FLuid is up I would recomm increasing lasix to 40 mg daily  Add 10 KCL    Follow up BMET and BNP in 10 days

## 2022-08-13 NOTE — Telephone Encounter (Signed)
Patient is returning call in regards to results. Requesting return call.  

## 2022-08-13 NOTE — Telephone Encounter (Signed)
I spoke with the pt and he wrote down his med changes... he will have repeat labs 08/28/22.

## 2022-08-13 NOTE — Telephone Encounter (Signed)
Left a message for the pt to call back.  

## 2022-08-24 ENCOUNTER — Other Ambulatory Visit: Payer: Self-pay | Admitting: Internal Medicine

## 2022-08-28 ENCOUNTER — Ambulatory Visit: Payer: Medicare Other | Attending: Internal Medicine

## 2022-08-28 DIAGNOSIS — R300 Dysuria: Secondary | ICD-10-CM | POA: Diagnosis not present

## 2022-08-28 DIAGNOSIS — R600 Localized edema: Secondary | ICD-10-CM | POA: Diagnosis not present

## 2022-08-28 DIAGNOSIS — I10 Essential (primary) hypertension: Secondary | ICD-10-CM

## 2022-08-28 DIAGNOSIS — Z79899 Other long term (current) drug therapy: Secondary | ICD-10-CM

## 2022-08-29 LAB — BASIC METABOLIC PANEL
BUN/Creatinine Ratio: 23 (ref 10–24)
BUN: 24 mg/dL (ref 8–27)
CO2: 26 mmol/L (ref 20–29)
Calcium: 9.5 mg/dL (ref 8.6–10.2)
Chloride: 103 mmol/L (ref 96–106)
Creatinine, Ser: 1.05 mg/dL (ref 0.76–1.27)
Glucose: 75 mg/dL (ref 70–99)
Potassium: 4.2 mmol/L (ref 3.5–5.2)
Sodium: 145 mmol/L — ABNORMAL HIGH (ref 134–144)
eGFR: 68 mL/min/{1.73_m2} (ref >59)

## 2022-08-29 LAB — PRO B NATRIURETIC PEPTIDE: NT-Pro BNP: 1660 pg/mL — ABNORMAL HIGH (ref 0–486)

## 2022-09-08 NOTE — Progress Notes (Signed)
Cardiology Office Note   Date:  09/14/2022   ID:  Leonard Wilcox, DOB Jun 25, 1930, MRN [id number]  PCP:  Daisy Floro, MD  Cardiologist:   Dietrich Pates, MD   Pt returns for f/u of LE edema     History of Present Illness: Leonard Wilcox is a 89 y.o. male  with hx of HTN, SVT, mild/mod AI and LE edema     I last saw the pt in clinic in Jan 2024  At this visit I recomm stopping HCTZ and starting carvedilol (did not tolerate amlodipine in the past)  Since seen he says his breathing is OK   Denies CP No dizziness     BP 120s to 140s at home  Labs in March BNP was elevated   Recomm increasing lasix to 40 mg with 10 KCL Repeat labs showed improvement in BNP but still eelvateion (1660)  Outpatient Medications Prior to Visit  Medication Sig Dispense Refill   Ascorbic Acid (VITAMIN C) 500 MG CAPS 1 tablet     B-Complex TABS      carvedilol (COREG) 3.125 MG tablet Take 1 tablet (3.125 mg total) by mouth 2 (two) times daily. 180 tablet 3   cephALEXin (KEFLEX) 500 MG capsule Take 500 mg by mouth 3 (three) times daily.     diclofenac Sodium (VOLTAREN) 1 % GEL Apply topically.     EPINEPHrine 0.3 mg/0.3 mL IJ SOAJ injection See admin instructions.     furosemide (LASIX) 40 MG tablet Take 1 tablet (40 mg total) by mouth daily. 90 tablet 3   hydrochlorothiazide (MICROZIDE) 12.5 MG capsule Take 12.5 mg by mouth daily.     hydroxychloroquine (PLAQUENIL) 200 MG tablet Take 200 mg by mouth 2 (two) times daily.     loratadine (CLARITIN) 10 MG tablet 1 tablet Orally Once a day     Magnesium 250 MG TABS 1 tablet     Multiple Vitamin (MULTIVITAMIN ADULT) TABS Orally     Multiple Vitamins-Minerals (PRESERVISION AREDS 2) CAPS Take 2 capsules by mouth daily in the afternoon.     Omega-3 Fatty Acids (FISH OIL) 1000 MG CAPS 1 capsule with a meal     potassium chloride (KLOR-CON) 10 MEQ tablet Take 1 tablet (10 mEq total) by mouth daily. 90 tablet 3   ramipril (ALTACE) 10 MG capsule 1 capsule      No facility-administered medications prior to visit.     Allergies:   Amlodipine besylate, Bee venom, Simvastatin, Sulfa antibiotics, Sulfasalazine, Itraconazole, and Other   Past Medical History:  Diagnosis Date   Arthritis     Past Surgical History:  Procedure Laterality Date   BRAIN SURGERY     EYE SURGERY       Social History:  The patient  reports that he has never smoked. He has never used smokeless tobacco. He reports that he does not drink alcohol and does not use drugs.   Family History:  The patient's family history includes Dementia in his mother; Stomach cancer in his father.    ROS:  Please see the history of present illness. All other systems are reviewed and  Negative to the above problem except as noted.    PHYSICAL EXAM: VS:  BP (!) 140/84   Pulse 89   Ht  (1.88 m)   Wt 156 lb 9.6 oz (71 kg)   SpO2 98%   BMI 20.11 kg/m   BP on my check 152/92   GEN: Well nourished, well  developed, in no acute distress  HEENT: normal  Neck: no JVD, carotid bruit Cardiac: RRR; no murmurs., Tr LE   edema  Respiratory:  clear to auscultation  GI: soft, nontender  No hepatomegaly     EKG:  EKG is not done   Echo   2022   1. Left ventricular ejection fraction, by estimation, is 60 to 65%. The  left ventricle has normal function. The left ventricle has no regional  wall motion abnormalities. Left ventricular diastolic parameters are  consistent with Grade I diastolic  dysfunction (impaired relaxation).   2. Right ventricular systolic function is normal. The right ventricular  size is normal. There is normal pulmonary artery systolic pressure. The  estimated right ventricular systolic pressure is 19.2 mmHg.   3. The mitral valve is grossly normal. Mild mitral valve regurgitation.  No evidence of mitral stenosis.   4. The aortic valve is tricuspid. There is mild calcification of the  aortic valve. There is mild thickening of the aortic valve. Aortic valve   regurgitation is mild to moderate. Aortic valve sclerosis is present, with  no evidence of aortic valve  stenosis.   5. The inferior vena cava is normal in size with greater than 50%  respiratory variability, suggesting right atrial pressure of 3 mmHg.   Lipid Panel No results found for: "CHOL", "TRIG", "HDL", "CHOLHDL", "VLDL", "LDLCALC", "LDLDIRECT"    Wt Readings from Last 3 Encounters:  09/14/22 156 lb 9.6 oz (71 kg)  08/08/22 162 lb (73.5 kg)  05/31/22 165 lb 9.6 oz (75.1 kg)      ASSESSMENT AND PLAN:  1  HTN  BP is better   Continue to follow   2  HFpEF    Clinically not bad  Will check BMET and BNP    Get echo to evaluate LVEF again  3  AV dz    Follow up echo to reevaluate   Follow up Sept Lowella Dandy   Labs:   BMET, BNP, TSH, Lipids, A1C and Vit D    Current medicines are reviewed at length with the patient today.  The patient does not have concerns regarding medicines.    Signed, Dietrich Pates, MD  09/14/2022 3:17 PM    [facility] [address] [place], [place], [place]  [address] Phone: [phone]; Fax: [phone]

## 2022-09-12 ENCOUNTER — Ambulatory Visit: Payer: Medicare Other | Admitting: Podiatry

## 2022-09-12 ENCOUNTER — Encounter: Payer: Self-pay | Admitting: Podiatry

## 2022-09-12 VITALS — BP 138/86

## 2022-09-12 DIAGNOSIS — M79674 Pain in right toe(s): Secondary | ICD-10-CM

## 2022-09-12 DIAGNOSIS — R52 Pain, unspecified: Secondary | ICD-10-CM

## 2022-09-12 DIAGNOSIS — M79675 Pain in left toe(s): Secondary | ICD-10-CM | POA: Diagnosis not present

## 2022-09-12 DIAGNOSIS — B351 Tinea unguium: Secondary | ICD-10-CM

## 2022-09-12 DIAGNOSIS — L84 Corns and callosities: Secondary | ICD-10-CM

## 2022-09-12 NOTE — Progress Notes (Signed)
  Subjective:  Patient ID: Leonard Wilcox, male    DOB: [birth year],  MRN: [id number]  Leonard Wilcox presents to clinic today for callus(es) both feet and painful thick toenails that are difficult to trim. Painful toenails interfere with ambulation. Aggravating factors include wearing enclosed shoe gear. Pain is relieved with periodic professional debridement. Painful calluses are aggravated when weightbearing with and without shoegear. Pain is relieved with periodic professional debridement.  Chief Complaint  Patient presents with   Nail Problem    R[facility] PCP-[facility] PCP VST-8 or 9 months ago   New problem(s): None.   PCP is Daisy Floro, MD.  Allergies  Allergen Reactions   Amlodipine Besylate Other (See Comments)    Other reaction(s): HA, flushed   Bee Venom     Other reaction(s): anaphylaxis   Simvastatin     Other reaction(s): muscle aches Other reaction(s): muscle aches   Sulfa Antibiotics Other (See Comments)    Allergic to bee sting, carries epi pen with him   Sulfasalazine Other (See Comments)    Allergic to bee sting, carries epi pen with him   Itraconazole Rash and Other (See Comments)    Other reaction(s): does tolerate lamisil/rash   Other Rash    Sporinox Other reaction(s): anaphylaxis    Review of Systems: Negative except as noted in the HPI.  Objective:  Vitals:   09/12/22 1044  BP: 138/86   Leonard Wilcox is a pleasant 89 y.o. male WD, WN in NAD. AAO x 3.  Vascular CFT immediate b/l LE. Palpable DP/PT pulses b/l LE. Digital hair sparse b/l. Skin temperature gradient WNL b/l. No pain with calf compression b/l. No edema noted b/l. No cyanosis or clubbing noted b/l LE.  Neurologic Normal speech. Oriented to person, place, and time. Pt has subjective symptoms of neuropathy. Protective sensation intact 5/5 intact bilaterally with 10g monofilament b/l. Vibratory sensation intact b/l.  Dermatologic Pedal integument with normal turgor, texture and tone  b/l LE. No open wounds b/l. No interdigital macerations b/l.   Toenails 1-5 b/l elongated, thickened, discolored with subungual debris. +Tenderness with dorsal palpation of nailplates.   Hyperkeratotic lesion(s) submet head 2 b/l.  No erythema, no edema, no drainage, no fluctuance.   Porokeratotic lesion(s) resolved submet head 1 right foot. No erythema, no edema, no drainage, no fluctuance.  Orthopedic: Muscle strength 5/5 to all lower extremity muscle groups bilaterally. Patient ambulates independent of any assistive aids.   Radiographs: None  Assessment/Plan: 1. Pain due to onychomycosis of toenails of both feet   2. Callus of foot   3. Pain     -Consent given for treatment as described below: -Examined patient. -Mycotic toenails 1-5 bilaterally were debrided in length and girth with sterile nail nippers and dremel without incident. -Callus(es) submet head 2 b/l pared utilizing sterile scalpel blade without complication or incident. Total number debrided =2. -Patient/POA to call should there be question/concern in the interim.   Return in about 3 months (around 12/12/2022).  Freddie Breech, DPM

## 2022-09-14 ENCOUNTER — Ambulatory Visit: Payer: Medicare Other | Attending: Internal Medicine | Admitting: Internal Medicine

## 2022-09-14 ENCOUNTER — Encounter: Payer: Self-pay | Admitting: Internal Medicine

## 2022-09-14 VITALS — BP 140/84 | HR 89 | Ht 74.0 in | Wt 156.6 lb

## 2022-09-14 DIAGNOSIS — I351 Nonrheumatic aortic (valve) insufficiency: Secondary | ICD-10-CM

## 2022-09-14 DIAGNOSIS — Z79899 Other long term (current) drug therapy: Secondary | ICD-10-CM | POA: Diagnosis not present

## 2022-09-14 DIAGNOSIS — E7800 Pure hypercholesterolemia, unspecified: Secondary | ICD-10-CM

## 2022-09-14 DIAGNOSIS — I10 Essential (primary) hypertension: Secondary | ICD-10-CM | POA: Diagnosis not present

## 2022-09-14 DIAGNOSIS — R609 Edema, unspecified: Secondary | ICD-10-CM | POA: Diagnosis not present

## 2022-09-14 NOTE — Patient Instructions (Signed)
Medication Instructions:   *If you need a refill on your cardiac medications before your next appointment, please call your pharmacy*   Lab Work: BMET, PRO BNP, HGBA1C, LIPID, VIT D, TSH  If you have labs (blood work) drawn today and your tests are completely normal, you will receive your results only by: MyChart Message (if you have MyChart) OR A paper copy in the mail If you have any lab test that is abnormal or we need to change your treatment, we will call you to review the results.   Testing/Procedures: Your physician has requested that you have an echocardiogram. Echocardiography is a painless test that uses sound waves to create images of your heart. It provides your doctor with information about the size and shape of your heart and how well your heart's chambers and valves are working. This procedure takes approximately one hour. There are no restrictions for this procedure. Please do NOT wear cologne, perfume, aftershave, or lotions (deodorant is allowed). Please arrive 15 minutes prior to your appointment time.    Follow-Up: At [facility], you and your health needs are our priority.  As part of our continuing mission to provide you with exceptional heart care, we have created designated Provider Care Teams.  These Care Teams include your primary Cardiologist (physician) and Advanced Practice Providers (APPs -  Physician Assistants and Nurse Practitioners) who all work together to provide you with the care you need, when you need it.  We recommend signing up for the patient portal called "MyChart".  Sign up information is provided on this After Visit Summary.  MyChart is used to connect with patients for Virtual Visits (Telemedicine).  Patients are able to view lab/test results, encounter notes, upcoming appointments, etc.  Non-urgent messages can be sent to your provider as well.   To learn more about what you can do with MyChart, go to [email or web address].    Your  next appointment:   5 month(s)  Provider:   Dietrich Pates, MD     Other Instructions

## 2022-09-15 LAB — TSH: TSH: 3.32 u[IU]/mL (ref 0.450–4.500)

## 2022-09-15 LAB — BASIC METABOLIC PANEL
BUN/Creatinine Ratio: 27 — ABNORMAL HIGH (ref 10–24)
BUN: 28 mg/dL — ABNORMAL HIGH (ref 8–27)
CO2: 24 mmol/L (ref 20–29)
Calcium: 9.2 mg/dL (ref 8.6–10.2)
Chloride: 103 mmol/L (ref 96–106)
Creatinine, Ser: 1.02 mg/dL (ref 0.76–1.27)
Glucose: 94 mg/dL (ref 70–99)
Potassium: 4.1 mmol/L (ref 3.5–5.2)
Sodium: 142 mmol/L (ref 134–144)
eGFR: 70 mL/min/{1.73_m2} (ref >59)

## 2022-09-15 LAB — HEMOGLOBIN A1C
Est. average glucose Bld gHb Est-mCnc: 123 mg/dL
Hgb A1c MFr Bld: 5.9 % — ABNORMAL HIGH (ref 4.8–5.6)

## 2022-09-15 LAB — LIPID PANEL
Chol/HDL Ratio: 2.4 ratio (ref 0.0–5.0)
Cholesterol, Total: 183 mg/dL (ref 100–199)
HDL: 76 mg/dL (ref >39)
LDL Chol Calc (NIH): 91 mg/dL (ref 0–99)
Triglycerides: 91 mg/dL (ref 0–149)
VLDL Cholesterol Cal: 16 mg/dL (ref 5–40)

## 2022-09-15 LAB — PRO B NATRIURETIC PEPTIDE: NT-Pro BNP: 1627 pg/mL — ABNORMAL HIGH (ref 0–486)

## 2022-09-15 LAB — VITAMIN D 25 HYDROXY (VIT D DEFICIENCY, FRACTURES): Vit D, 25-Hydroxy: 40.3 ng/mL (ref 30.0–100.0)

## 2022-09-17 ENCOUNTER — Other Ambulatory Visit: Payer: Self-pay

## 2022-09-17 DIAGNOSIS — E7800 Pure hypercholesterolemia, unspecified: Secondary | ICD-10-CM

## 2022-09-17 DIAGNOSIS — R600 Localized edema: Secondary | ICD-10-CM

## 2022-09-17 DIAGNOSIS — I351 Nonrheumatic aortic (valve) insufficiency: Secondary | ICD-10-CM

## 2022-09-17 DIAGNOSIS — I10 Essential (primary) hypertension: Secondary | ICD-10-CM

## 2022-09-17 DIAGNOSIS — Z79899 Other long term (current) drug therapy: Secondary | ICD-10-CM

## 2022-09-17 DIAGNOSIS — R300 Dysuria: Secondary | ICD-10-CM

## 2022-09-17 MED ORDER — VITAMIN D3 1000 UNITS PO CAPS: 1000.0000 [IU] | ORAL_CAPSULE | Freq: Every day | ORAL | 3 refills | Status: AC

## 2022-09-17 MED ORDER — FUROSEMIDE 40 MG PO TABS: ORAL_TABLET | ORAL | 3 refills | Status: DC

## 2022-09-19 ENCOUNTER — Encounter: Payer: Self-pay | Admitting: Internal Medicine

## 2022-10-01 ENCOUNTER — Ambulatory Visit: Payer: Medicare Other | Attending: Internal Medicine

## 2022-10-01 DIAGNOSIS — E7800 Pure hypercholesterolemia, unspecified: Secondary | ICD-10-CM

## 2022-10-01 DIAGNOSIS — I351 Nonrheumatic aortic (valve) insufficiency: Secondary | ICD-10-CM

## 2022-10-01 DIAGNOSIS — R600 Localized edema: Secondary | ICD-10-CM | POA: Diagnosis not present

## 2022-10-01 DIAGNOSIS — I10 Essential (primary) hypertension: Secondary | ICD-10-CM

## 2022-10-01 DIAGNOSIS — R2689 Other abnormalities of gait and mobility: Secondary | ICD-10-CM | POA: Diagnosis not present

## 2022-10-01 DIAGNOSIS — M353 Polymyalgia rheumatica: Secondary | ICD-10-CM | POA: Diagnosis not present

## 2022-10-01 DIAGNOSIS — R000 Tachycardia, unspecified: Secondary | ICD-10-CM | POA: Diagnosis not present

## 2022-10-01 DIAGNOSIS — R300 Dysuria: Secondary | ICD-10-CM

## 2022-10-01 DIAGNOSIS — Z79899 Other long term (current) drug therapy: Secondary | ICD-10-CM | POA: Diagnosis not present

## 2022-10-01 LAB — BASIC METABOLIC PANEL
BUN/Creatinine Ratio: 18 (ref 10–24)
BUN: 20 mg/dL (ref 8–27)
Chloride: 101 mmol/L (ref 96–106)
Creatinine, Ser: 1.09 mg/dL (ref 0.76–1.27)
eGFR: 65 mL/min/{1.73_m2} (ref >59)

## 2022-10-01 LAB — PRO B NATRIURETIC PEPTIDE

## 2022-10-02 LAB — BASIC METABOLIC PANEL
CO2: 27 mmol/L (ref 20–29)
Calcium: 9.4 mg/dL (ref 8.6–10.2)
Glucose: 85 mg/dL (ref 70–99)
Potassium: 4 mmol/L (ref 3.5–5.2)
Sodium: 139 mmol/L (ref 134–144)

## 2022-10-04 ENCOUNTER — Telehealth: Payer: Self-pay | Admitting: Internal Medicine

## 2022-10-04 DIAGNOSIS — Z79899 Other long term (current) drug therapy: Secondary | ICD-10-CM

## 2022-10-04 DIAGNOSIS — R300 Dysuria: Secondary | ICD-10-CM

## 2022-10-04 DIAGNOSIS — I10 Essential (primary) hypertension: Secondary | ICD-10-CM

## 2022-10-04 DIAGNOSIS — I351 Nonrheumatic aortic (valve) insufficiency: Secondary | ICD-10-CM

## 2022-10-04 DIAGNOSIS — R600 Localized edema: Secondary | ICD-10-CM

## 2022-10-04 MED ORDER — FUROSEMIDE 40 MG PO TABS: ORAL_TABLET | ORAL | 3 refills | Status: DC

## 2022-10-04 NOTE — Telephone Encounter (Signed)
Patient was returning call. Please advise ?

## 2022-10-04 NOTE — Telephone Encounter (Addendum)
Fluid is still up   How is he feeling   Could try increase lasix 80 to every other day and follow up BMET and BNP in 10 days  I spoke with the pt and he says that he is feeling quite well.. he asked to go over possible symptoms of fluid overload and he denies most... no SOB, still able to lay flat to sleep at night... but does have lower extremity edema.   He will try the lasix 40 mg qod alt with 80 mg and have repeat labs 10/15/22... he says he is urinating so much that it has been rough on him but he will try this to get the extra fluid off and we can talk with Dr Tenny Craw after his next labs.

## 2022-10-11 ENCOUNTER — Ambulatory Visit (HOSPITAL_COMMUNITY): Payer: Medicare Other | Attending: Internal Medicine

## 2022-10-11 DIAGNOSIS — Z79899 Other long term (current) drug therapy: Secondary | ICD-10-CM | POA: Diagnosis not present

## 2022-10-11 DIAGNOSIS — I10 Essential (primary) hypertension: Secondary | ICD-10-CM | POA: Diagnosis not present

## 2022-10-11 DIAGNOSIS — E7800 Pure hypercholesterolemia, unspecified: Secondary | ICD-10-CM | POA: Diagnosis not present

## 2022-10-11 DIAGNOSIS — I351 Nonrheumatic aortic (valve) insufficiency: Secondary | ICD-10-CM

## 2022-10-11 LAB — ECHOCARDIOGRAM COMPLETE
Area-P 1/2: 4.36 cm2
P 1/2 time: 608 msec
S' Lateral: 3.1 cm

## 2022-10-12 ENCOUNTER — Telehealth: Payer: Self-pay

## 2022-10-12 NOTE — Telephone Encounter (Signed)
-----   Message from Pricilla Riffle, MD sent at 10/11/2022  4:21 PM EDT ----- Overall I think pumping function of the heart is OK AV regurgitation is moderate     Will continue to follow clinicially

## 2022-10-12 NOTE — Telephone Encounter (Signed)
Left as message for the pt to call back. My Chart result sent to the pt.

## 2022-10-13 NOTE — Telephone Encounter (Signed)
Pt has viewed Echo report in MY Chart will close this encounter.

## 2022-10-15 ENCOUNTER — Ambulatory Visit: Payer: Medicare Other | Attending: Internal Medicine

## 2022-10-15 DIAGNOSIS — I10 Essential (primary) hypertension: Secondary | ICD-10-CM

## 2022-10-15 DIAGNOSIS — I351 Nonrheumatic aortic (valve) insufficiency: Secondary | ICD-10-CM

## 2022-10-15 DIAGNOSIS — Z79899 Other long term (current) drug therapy: Secondary | ICD-10-CM

## 2022-10-15 DIAGNOSIS — R600 Localized edema: Secondary | ICD-10-CM | POA: Diagnosis not present

## 2022-10-15 DIAGNOSIS — R300 Dysuria: Secondary | ICD-10-CM

## 2022-10-16 LAB — BASIC METABOLIC PANEL
BUN/Creatinine Ratio: 28 — ABNORMAL HIGH (ref 10–24)
BUN: 33 mg/dL — ABNORMAL HIGH (ref 8–27)
CO2: 27 mmol/L (ref 20–29)
Calcium: 9.3 mg/dL (ref 8.6–10.2)
Chloride: 103 mmol/L (ref 96–106)
Creatinine, Ser: 1.16 mg/dL (ref 0.76–1.27)
Glucose: 83 mg/dL (ref 70–99)
Potassium: 4.4 mmol/L (ref 3.5–5.2)
Sodium: 142 mmol/L (ref 134–144)
eGFR: 60 mL/min/{1.73_m2} (ref >59)

## 2022-10-16 LAB — PRO B NATRIURETIC PEPTIDE: NT-Pro BNP: 786 pg/mL — ABNORMAL HIGH (ref 0–486)

## 2022-10-19 ENCOUNTER — Telehealth: Payer: Self-pay

## 2022-10-19 DIAGNOSIS — I351 Nonrheumatic aortic (valve) insufficiency: Secondary | ICD-10-CM

## 2022-10-19 DIAGNOSIS — I10 Essential (primary) hypertension: Secondary | ICD-10-CM

## 2022-10-19 DIAGNOSIS — Z79899 Other long term (current) drug therapy: Secondary | ICD-10-CM

## 2022-10-19 NOTE — Addendum Note (Signed)
Addended by: Eilleen Kempf on: 10/19/2022 04:38 PM   Modules accepted: Orders

## 2022-10-19 NOTE — Telephone Encounter (Signed)
-----   Message from Pricilla Riffle, MD sent at 10/18/2022  4:16 PM EDT ----- Kidney function is overall not bad   Cr 1.16 Fluid is improved on current regimen I would continue for another week 80 every other day then decrease to 40 with 80 2x per week Follow up BMET and BNP in 3 wks

## 2022-10-19 NOTE — Telephone Encounter (Signed)
Spoke with patient to review lab results and recommendations per Dr Tenny Craw. Patient will come in on 6/14 for BMET and BNP.  Patient verbalized understanding and had no questions.

## 2022-10-28 ENCOUNTER — Encounter: Payer: Self-pay | Admitting: Internal Medicine

## 2022-10-29 ENCOUNTER — Other Ambulatory Visit: Payer: Self-pay

## 2022-10-29 MED ORDER — FUROSEMIDE 40 MG PO TABS: ORAL_TABLET | ORAL | 3 refills | Status: DC

## 2022-11-01 DIAGNOSIS — N138 Other obstructive and reflux uropathy: Secondary | ICD-10-CM | POA: Diagnosis not present

## 2022-11-01 DIAGNOSIS — R300 Dysuria: Secondary | ICD-10-CM | POA: Diagnosis not present

## 2022-11-09 ENCOUNTER — Ambulatory Visit: Payer: Medicare Other | Attending: Internal Medicine

## 2022-11-09 DIAGNOSIS — I10 Essential (primary) hypertension: Secondary | ICD-10-CM | POA: Diagnosis not present

## 2022-11-09 DIAGNOSIS — I351 Nonrheumatic aortic (valve) insufficiency: Secondary | ICD-10-CM | POA: Diagnosis not present

## 2022-11-09 DIAGNOSIS — R0600 Dyspnea, unspecified: Secondary | ICD-10-CM | POA: Diagnosis not present

## 2022-11-09 DIAGNOSIS — Z79899 Other long term (current) drug therapy: Secondary | ICD-10-CM | POA: Diagnosis not present

## 2022-11-10 LAB — PRO B NATRIURETIC PEPTIDE: NT-Pro BNP: 1377 pg/mL — ABNORMAL HIGH (ref 0–486)

## 2022-11-15 ENCOUNTER — Encounter: Payer: Self-pay | Admitting: Internal Medicine

## 2022-11-15 DIAGNOSIS — Z79899 Other long term (current) drug therapy: Secondary | ICD-10-CM

## 2022-11-15 DIAGNOSIS — I359 Nonrheumatic aortic valve disorder, unspecified: Secondary | ICD-10-CM | POA: Diagnosis not present

## 2022-11-15 DIAGNOSIS — M0609 Rheumatoid arthritis without rheumatoid factor, multiple sites: Secondary | ICD-10-CM | POA: Diagnosis not present

## 2022-11-15 DIAGNOSIS — I10 Essential (primary) hypertension: Secondary | ICD-10-CM

## 2022-11-15 DIAGNOSIS — M353 Polymyalgia rheumatica: Secondary | ICD-10-CM | POA: Diagnosis not present

## 2022-11-15 DIAGNOSIS — Z6820 Body mass index (BMI) 20.0-20.9, adult: Secondary | ICD-10-CM | POA: Diagnosis not present

## 2022-11-16 MED ORDER — FUROSEMIDE 40 MG PO TABS: ORAL_TABLET | ORAL | Status: DC

## 2022-11-16 NOTE — Telephone Encounter (Signed)
Called and spoke with patient regarding lab results.  Per Dr. Tenny Craw: Review of BNP   Elevated again  BMET was not drawn I would recomm going back to 80 alternating with 40 mg as did previously   Fluid overall appeared better and kidney function stable  Follow up BMET and BNP in 4 wks after making change    Medication list updated. BNP and BMET ordered, lab appt scheduled for 12/14/22.  Patient verbalized understanding and expressed appreciation for call.

## 2022-11-20 DIAGNOSIS — D333 Benign neoplasm of cranial nerves: Secondary | ICD-10-CM | POA: Diagnosis not present

## 2022-12-05 DIAGNOSIS — E7800 Pure hypercholesterolemia, unspecified: Secondary | ICD-10-CM | POA: Diagnosis not present

## 2022-12-05 DIAGNOSIS — I10 Essential (primary) hypertension: Secondary | ICD-10-CM | POA: Diagnosis not present

## 2022-12-06 DIAGNOSIS — N138 Other obstructive and reflux uropathy: Secondary | ICD-10-CM | POA: Diagnosis not present

## 2022-12-06 DIAGNOSIS — N35014 Post-traumatic urethral stricture, male, unspecified: Secondary | ICD-10-CM | POA: Diagnosis not present

## 2022-12-06 DIAGNOSIS — N3289 Other specified disorders of bladder: Secondary | ICD-10-CM | POA: Diagnosis not present

## 2022-12-11 DIAGNOSIS — R945 Abnormal results of liver function studies: Secondary | ICD-10-CM | POA: Diagnosis not present

## 2022-12-11 DIAGNOSIS — M353 Polymyalgia rheumatica: Secondary | ICD-10-CM | POA: Diagnosis not present

## 2022-12-11 DIAGNOSIS — E7800 Pure hypercholesterolemia, unspecified: Secondary | ICD-10-CM | POA: Diagnosis not present

## 2022-12-11 DIAGNOSIS — L4053 Psoriatic spondylitis: Secondary | ICD-10-CM | POA: Diagnosis not present

## 2022-12-11 DIAGNOSIS — Z6820 Body mass index (BMI) 20.0-20.9, adult: Secondary | ICD-10-CM | POA: Diagnosis not present

## 2022-12-11 DIAGNOSIS — Z0000 Encounter for general adult medical examination without abnormal findings: Secondary | ICD-10-CM | POA: Diagnosis not present

## 2022-12-11 DIAGNOSIS — M0609 Rheumatoid arthritis without rheumatoid factor, multiple sites: Secondary | ICD-10-CM | POA: Diagnosis not present

## 2022-12-14 ENCOUNTER — Other Ambulatory Visit: Payer: Self-pay | Admitting: Internal Medicine

## 2022-12-14 ENCOUNTER — Ambulatory Visit: Payer: Medicare Other | Attending: Internal Medicine

## 2022-12-14 DIAGNOSIS — Z79899 Other long term (current) drug therapy: Secondary | ICD-10-CM

## 2022-12-14 DIAGNOSIS — I10 Essential (primary) hypertension: Secondary | ICD-10-CM | POA: Diagnosis not present

## 2022-12-15 LAB — BASIC METABOLIC PANEL
BUN/Creatinine Ratio: 29 — ABNORMAL HIGH (ref 10–24)
BUN: 26 mg/dL (ref 8–27)
CO2: 26 mmol/L (ref 20–29)
Calcium: 9.5 mg/dL (ref 8.6–10.2)
Chloride: 103 mmol/L (ref 96–106)
Creatinine, Ser: 0.91 mg/dL (ref 0.76–1.27)
Glucose: 91 mg/dL (ref 70–99)
Potassium: 3.9 mmol/L (ref 3.5–5.2)
Sodium: 141 mmol/L (ref 134–144)
eGFR: 81 mL/min/{1.73_m2} (ref >59)

## 2022-12-15 LAB — PRO B NATRIURETIC PEPTIDE: NT-Pro BNP: 1629 pg/mL — ABNORMAL HIGH (ref 0–486)

## 2022-12-17 ENCOUNTER — Telehealth: Payer: Self-pay

## 2022-12-17 NOTE — Telephone Encounter (Signed)
-----   Message from [place] sent at 12/17/2022  3:04 PM EDT ----- Fluid levels appear elevated.   How is his breathing?  Does he have any swelling in feet.

## 2022-12-17 NOTE — Telephone Encounter (Signed)
I called the pt about his lab results and he says that he has not been having much peripheral edema... he says he can lay flat to sleep and his breathing has been good except when he is at [employer] exercising... he says that he gives out easier with fatigue and some increased SOB but he feels he is urinating quite a bit and unsure he can handle any further fluid pills that it would make it hard to go out in public.   He also says his weight has been down from 155 where it had been staying to gradually down to 146 over quite some time.   He has been taking Lasix 40 mg every other day alt with 80 mg.   KCL 10 meq every day  No HCTZ.. he say he was taken off of it on 09/14/22.   I will forward to Dr Tenny Craw to review.

## 2022-12-20 ENCOUNTER — Encounter: Payer: Self-pay | Admitting: Internal Medicine

## 2023-01-07 ENCOUNTER — Encounter: Payer: Self-pay | Admitting: Internal Medicine

## 2023-01-10 NOTE — Telephone Encounter (Signed)
His BNP has been elevated over past few months   Kidney function is OK When seen in clinic this spring his volume status did not appear bad DOes he have ankle swelling now? Keep track of weights Ideally come into clinic to reevaluate

## 2023-01-11 ENCOUNTER — Telehealth: Payer: Self-pay | Admitting: Internal Medicine

## 2023-01-11 NOTE — Telephone Encounter (Signed)
Patient called to confirm his 8/20 visit.

## 2023-01-11 NOTE — Telephone Encounter (Signed)
Detailed message of appt on 8/20 at 9 am with Dr Tenny Craw .Zack Seal Appears another appt is scheduled for 9/4 not sure if that appt is needed TBD ./cy

## 2023-01-14 NOTE — Progress Notes (Unsigned)
Cardiology Office Note   Date:  01/15/2023   ID:  Leonard Wilcox, DOB 03-03-1931, MRN [id number]  PCP:  Daisy Floro, MD  Cardiologist:   Dietrich Pates, MD   Pt returns for f/u of LE edema     History of Present Illness: Leonard Wilcox is a 89 y.o. male  with hx of HTN, SVT, mild/mod AI and LE edema     I last saw the pt in clinic in Jan 2024  At this visit I recomm stopping HCTZ and starting carvedilol (did not tolerate amlodipine in the past)  Since seen he says his breathing is OK   Denies CP No dizziness     BP 120s to 140s at home  Labs in March BNP was elevated   Recomm increasing lasix to 40 mg with 10 KCL Repeat labs showed improvement in BNP but still eelvateion (1660)  I saw the pt in APril 2024    Since seen breathing is stable   No significant  No palpitations   No dizziness BP at home 120s/   Patient concerned about BNP   Elevated in July   Outpatient Medications Prior to Visit  Medication Sig Dispense Refill   Ascorbic Acid (VITAMIN C) 500 MG CAPS 1 tablet     B-Complex TABS      carvedilol (COREG) 3.125 MG tablet Take 1 tablet (3.125 mg total) by mouth 2 (two) times daily. 180 tablet 3   cephALEXin (KEFLEX) 500 MG capsule Take 500 mg by mouth 3 (three) times daily.     Cholecalciferol (VITAMIN D3) 1000 units CAPS Take 1,000 Units by mouth daily in the afternoon. 100 capsule 3   diclofenac Sodium (VOLTAREN) 1 % GEL Apply topically.     EPINEPHrine 0.3 mg/0.3 mL IJ SOAJ injection See admin instructions.     furosemide (LASIX) 40 MG tablet Take 1 tablet (40 mg) by mouth daily alternating with 2 tablets (80 mg) every other day.     hydroxychloroquine (PLAQUENIL) 200 MG tablet Take 200 mg by mouth 2 (two) times daily.     loratadine (CLARITIN) 10 MG tablet 1 tablet Orally Once a day     Magnesium 250 MG TABS 1 tablet     Multiple Vitamin (MULTIVITAMIN ADULT) TABS Orally     Multiple Vitamins-Minerals (PRESERVISION AREDS 2) CAPS Take 2 capsules by mouth  daily in the afternoon.     Omega-3 Fatty Acids (FISH OIL) 1000 MG CAPS 1 capsule with a meal     potassium chloride (KLOR-CON) 10 MEQ tablet Take 1 tablet (10 mEq total) by mouth daily. 90 tablet 3   ramipril (ALTACE) 10 MG capsule 1 capsule     No facility-administered medications prior to visit.     Allergies:   Amlodipine besylate, Bee venom, Simvastatin, Sulfa antibiotics, Sulfasalazine, Itraconazole, and Other   Past Medical History:  Diagnosis Date   Arthritis     Past Surgical History:  Procedure Laterality Date   BRAIN SURGERY     EYE SURGERY       Social History:  The patient  reports that he has never smoked. He has never used smokeless tobacco. He reports that he does not drink alcohol and does not use drugs.   Family History:  The patient's family history includes Dementia in his mother; Stomach cancer in his father.    ROS:  Please see the history of present illness. All other systems are reviewed and  Negative to the above problem  except as noted.    PHYSICAL EXAM: VS:  BP 136/78   Pulse 92   Ht 6\' 2"  (1.88 m)   Wt 157 lb 9.6 oz (71.5 kg)   SpO2 99%   BMI 20.23 kg/m    GEN: Thin 89 yo in NAD  HEENT: normal  Neck: External JVP elevatd  Not internal  Cardiac: RRR; no murmurs., No  LE   edema  Respiratory:  clear to auscultation  GI: soft, nontender  No hepatomegaly     EKG:  EKG  done   SR 82  LVH  ST T wave changes consistent with strain pattern  Echo  May 2024   1. Left ventricular ejection fraction, by estimation, is 45 to 50%. The  left ventricle has mildly decreased function. The left ventricle  demonstrates global hypokinesis. There is mild left ventricular  hypertrophy. Left ventricular diastolic parameters  are indeterminate.   2. Right ventricular systolic function is normal. The right ventricular  size is mildly enlarged.   3. The mitral valve is normal in structure. No evidence of mitral valve  regurgitation. No evidence of mitral  stenosis.   4. The aortic valve is calcified. Aortic valve regurgitation is moderate.  Aortic valve sclerosis/calcification is present, without any evidence of  aortic stenosis. Aortic regurgitation PHT measures 608 msec.   5. Aortic dilatation noted. There is mild dilatation of the ascending  aorta, measuring 41 mm.   6. The inferior vena cava is normal in size with greater than 50%  respiratory variability, suggesting right atrial pressure of 3 mmHg.   Lipid Panel    Component Value Date/Time   CHOL 183 09/14/2022 1538   TRIG 91 09/14/2022 1538   HDL 76 09/14/2022 1538   CHOLHDL 2.4 09/14/2022 1538   LDLCALC 91 09/14/2022 1538      Wt Readings from Last 3 Encounters:  01/15/23 157 lb 9.6 oz (71.5 kg)  09/14/22 156 lb 9.6 oz (71 kg)  08/08/22 162 lb (73.5 kg)      ASSESSMENT AND PLAN:  1  HTN  BP is better   Continue to follow   2  HFpEF   On exam volume looks good   Pt concerned about BNP REcom: Will recheck since labs from 1 month ago   If still high will have him take 80 mg daily x2 then alternate 80 /40 as doing   May need to do intermittently Follow BMET / BNP in 1 wks     3  AV dz    Aortic insufficiency  No murmur on exam   Echo shows moderate AI (May 2024)  Control BP   Follow periodically    4  HCM    LDL 106  HDL 73  Trig 59     Follow up in Jan     Current medicines are reviewed at length with the patient today.  The patient does not have concerns regarding medicines.    Signed, Dietrich Pates, MD  01/15/2023 9:33 AM    [facility] [address] [place], [place], [place]  [address] Phone: [phone]; Fax: [phone]

## 2023-01-15 ENCOUNTER — Ambulatory Visit: Payer: Medicare Other | Attending: Internal Medicine | Admitting: Internal Medicine

## 2023-01-15 ENCOUNTER — Encounter: Payer: Self-pay | Admitting: Internal Medicine

## 2023-01-15 VITALS — BP 136/78 | HR 92 | Ht 74.0 in | Wt 157.6 lb

## 2023-01-15 DIAGNOSIS — I10 Essential (primary) hypertension: Secondary | ICD-10-CM

## 2023-01-15 DIAGNOSIS — Z79899 Other long term (current) drug therapy: Secondary | ICD-10-CM

## 2023-01-15 DIAGNOSIS — R300 Dysuria: Secondary | ICD-10-CM | POA: Diagnosis not present

## 2023-01-15 DIAGNOSIS — R600 Localized edema: Secondary | ICD-10-CM

## 2023-01-15 DIAGNOSIS — I351 Nonrheumatic aortic (valve) insufficiency: Secondary | ICD-10-CM | POA: Diagnosis not present

## 2023-01-15 NOTE — Patient Instructions (Signed)
Medication Instructions:  Take Lasix 80 mg today and tomorrow and then back to your normal dosing *If you need a refill on your cardiac medications before your next appointment, please call your pharmacy*   Lab Work: Pro bnp today Pro bnp and bmet in one week 01/23/23  If you have labs (blood work) drawn today and your tests are completely normal, you will receive your results only by: MyChart Message (if you have MyChart) OR A paper copy in the mail If you have any lab test that is abnormal or we need to change your treatment, we will call you to review the results.   Testing/Procedures:    Follow-Up: At [facility], you and your health needs are our priority.  As part of our continuing mission to provide you with exceptional heart care, we have created designated Provider Care Teams.  These Care Teams include your primary Cardiologist (physician) and Advanced Practice Providers (APPs -  Physician Assistants and Nurse Practitioners) who all work together to provide you with the care you need, when you need it.  We recommend signing up for the patient portal called "MyChart".  Sign up information is provided on this After Visit Summary.  MyChart is used to connect with patients for Virtual Visits (Telemedicine).  Patients are able to view lab/test results, encounter notes, upcoming appointments, etc.  Non-urgent messages can be sent to your provider as well.   To learn more about what you can do with MyChart, go to [email or web address].    Your next appointment:   6 month(s)  Provider:   Dietrich Pates, MD     Other Instructions

## 2023-01-16 LAB — PRO B NATRIURETIC PEPTIDE: NT-Pro BNP: 1015 pg/mL — ABNORMAL HIGH (ref 0–486)

## 2023-01-17 ENCOUNTER — Telehealth: Payer: Self-pay

## 2023-01-17 NOTE — Telephone Encounter (Signed)
Left a message for the pt to call back.   He can take Lasix 80 mg today and tomorrow and back to every other day alt with 40 mg per Dr Tenny Craw.

## 2023-01-17 NOTE — Telephone Encounter (Signed)
I spoke with patient and reviewed lab results and recommendations from Dr Tenny Craw with him

## 2023-01-17 NOTE — Telephone Encounter (Signed)
-----   Message from [place] sent at 01/16/2023  4:16 PM EDT ----- BNP still elevated but not as bad  Can take 80mg , 80 mg then back to alternating

## 2023-01-17 NOTE — Telephone Encounter (Signed)
Follow Up:        Patient is returning a call from [place] [facility] from today.mm

## 2023-01-23 ENCOUNTER — Ambulatory Visit: Payer: Medicare Other | Attending: Internal Medicine

## 2023-01-23 DIAGNOSIS — Z79899 Other long term (current) drug therapy: Secondary | ICD-10-CM

## 2023-01-23 DIAGNOSIS — I10 Essential (primary) hypertension: Secondary | ICD-10-CM

## 2023-01-23 DIAGNOSIS — R300 Dysuria: Secondary | ICD-10-CM | POA: Diagnosis not present

## 2023-01-23 DIAGNOSIS — I351 Nonrheumatic aortic (valve) insufficiency: Secondary | ICD-10-CM | POA: Diagnosis not present

## 2023-01-23 DIAGNOSIS — R600 Localized edema: Secondary | ICD-10-CM | POA: Diagnosis not present

## 2023-01-24 LAB — BASIC METABOLIC PANEL
BUN/Creatinine Ratio: 23 (ref 10–24)
BUN: 25 mg/dL (ref 8–27)
CO2: 29 mmol/L (ref 20–29)
Calcium: 9.4 mg/dL (ref 8.6–10.2)
Chloride: 100 mmol/L (ref 96–106)
Creatinine, Ser: 1.07 mg/dL (ref 0.76–1.27)
Glucose: 79 mg/dL (ref 70–99)
Potassium: 4 mmol/L (ref 3.5–5.2)
Sodium: 140 mmol/L (ref 134–144)
eGFR: 66 mL/min/{1.73_m2} (ref >59)

## 2023-01-24 LAB — PRO B NATRIURETIC PEPTIDE: NT-Pro BNP: 1099 pg/mL — ABNORMAL HIGH (ref 0–486)

## 2023-01-30 ENCOUNTER — Ambulatory Visit: Payer: Medicare Other | Admitting: Internal Medicine

## 2023-01-31 ENCOUNTER — Encounter: Payer: Self-pay | Admitting: Internal Medicine

## 2023-02-06 ENCOUNTER — Encounter: Payer: Self-pay | Admitting: Podiatry

## 2023-02-06 ENCOUNTER — Ambulatory Visit: Payer: Medicare Other | Admitting: Podiatry

## 2023-02-06 DIAGNOSIS — B351 Tinea unguium: Secondary | ICD-10-CM | POA: Diagnosis not present

## 2023-02-06 DIAGNOSIS — M79675 Pain in left toe(s): Secondary | ICD-10-CM | POA: Diagnosis not present

## 2023-02-06 DIAGNOSIS — M79674 Pain in right toe(s): Secondary | ICD-10-CM

## 2023-02-06 DIAGNOSIS — M79671 Pain in right foot: Secondary | ICD-10-CM

## 2023-02-06 DIAGNOSIS — L84 Corns and callosities: Secondary | ICD-10-CM

## 2023-02-06 DIAGNOSIS — M79672 Pain in left foot: Secondary | ICD-10-CM

## 2023-02-10 NOTE — Progress Notes (Signed)
Subjective:  Patient ID: Leonard Wilcox, male    DOB: 01/04/1931,  MRN: [id number]  Leonard Wilcox presents to clinic today for callus(es) of both feet and painful thick toenails that are difficult to trim. Painful toenails interfere with ambulation. Aggravating factors include wearing enclosed shoe gear. Pain is relieved with periodic professional debridement. Painful calluses are aggravated when weightbearing with and without shoegear. Pain is relieved with periodic professional debridement. He states he enjoyed a surprise 89th birthday celebration at church with his family. Chief Complaint  Patient presents with   RFC    RFC   New problem(s): None.   PCP is Daisy Floro, MD.  Allergies  Allergen Reactions   Amlodipine Besylate Other (See Comments)    Other reaction(s): HA, flushed   Bee Venom     Other reaction(s): anaphylaxis   Simvastatin     Other reaction(s): muscle aches Other reaction(s): muscle aches   Sulfa Antibiotics Other (See Comments)    Allergic to bee sting, carries epi pen with him   Sulfasalazine Other (See Comments)    Allergic to bee sting, carries epi pen with him   Itraconazole Rash and Other (See Comments)    Other reaction(s): does tolerate lamisil/rash   Other Rash    Sporinox Other reaction(s): anaphylaxis    Review of Systems: Negative except as noted in the HPI.  Objective: No changes noted in today's physical examination. There were no vitals filed for this visit. Leonard Wilcox is a pleasant 89 y.o. male WD, WN in NAD. AAO x 3.  Vascular Examination: Capillary refill time immediate b/l. Vascular status intact b/l with palpable pedal pulses. Pedal hair present b/l. No pain with calf compression b/l. Skin temperature gradient WNL b/l. No cyanosis or clubbing b/l. No ischemia or gangrene noted b/l.   Neurological Examination: Sensation grossly intact b/l with 10 gram monofilament. Vibratory sensation intact b/l. Pt has subjective  symptoms of neuropathy.  Dermatological Examination: Pedal skin with normal turgor, texture and tone b/l.  No open wounds. No interdigital macerations.   Toenails 1-5 b/l thick, discolored, elongated with subungual debris and pain on dorsal palpation.   Hyperkeratotic lesion(s) submet head 2 b/l.  No erythema, no edema, no drainage, no fluctuance.  Musculoskeletal Examination: Muscle strength 5/5 to all lower extremity muscle groups bilaterally. Plantarflexed metatarsal(s) 5th metatarsal head of both feet. Patient ambulates independent of any assistive aids.  Radiographs: None  Last A1c:      Latest Ref Rng & Units 09/14/2022    3:38 PM  Hemoglobin A1C  Hemoglobin-A1c 4.8 - 5.6 % 5.9    Assessment/Plan: 1. Pain due to onychomycosis of toenails of both feet   2. Callus of foot   3. Pain in both feet     Patient was evaluated and treated. All patient's and/or POA's questions/concerns addressed on today's visit. Toenails 1-5 debrided in length and girth without incident. Callus(es) submet head 2 b/l pared with sharp debridement without incident. Continue soft, supportive shoe gear daily. Report any pedal injuries to medical professional. Call office if there are any questions/concerns. -Patient/POA to call should there be question/concern in the interim.   Return in about 3 months (around 05/08/2023).  Freddie Breech, DPM

## 2023-03-11 ENCOUNTER — Encounter: Payer: Self-pay | Admitting: Internal Medicine

## 2023-03-11 DIAGNOSIS — I10 Essential (primary) hypertension: Secondary | ICD-10-CM

## 2023-03-11 DIAGNOSIS — R600 Localized edema: Secondary | ICD-10-CM

## 2023-03-11 DIAGNOSIS — Z79899 Other long term (current) drug therapy: Secondary | ICD-10-CM

## 2023-03-11 DIAGNOSIS — I351 Nonrheumatic aortic (valve) insufficiency: Secondary | ICD-10-CM

## 2023-03-11 NOTE — Telephone Encounter (Signed)
Check  BMET and BNP again  Does he have any swelling in legs?   How is his breathing?

## 2023-03-12 ENCOUNTER — Ambulatory Visit: Payer: Medicare Other | Attending: Internal Medicine

## 2023-03-12 DIAGNOSIS — Z79899 Other long term (current) drug therapy: Secondary | ICD-10-CM

## 2023-03-12 DIAGNOSIS — I10 Essential (primary) hypertension: Secondary | ICD-10-CM | POA: Diagnosis not present

## 2023-03-12 DIAGNOSIS — I351 Nonrheumatic aortic (valve) insufficiency: Secondary | ICD-10-CM

## 2023-03-12 DIAGNOSIS — R600 Localized edema: Secondary | ICD-10-CM

## 2023-03-12 NOTE — Telephone Encounter (Signed)
I talked with the pt and he will have his labs drawn today... he says his breathing is good, not labored and feeling very well... he has some edema of his lower extremities but not any worse than his usual.. will follow up with him after his lab results are back.

## 2023-03-13 ENCOUNTER — Other Ambulatory Visit: Payer: Self-pay

## 2023-03-13 DIAGNOSIS — R600 Localized edema: Secondary | ICD-10-CM

## 2023-03-13 DIAGNOSIS — I10 Essential (primary) hypertension: Secondary | ICD-10-CM

## 2023-03-13 DIAGNOSIS — I351 Nonrheumatic aortic (valve) insufficiency: Secondary | ICD-10-CM

## 2023-03-13 DIAGNOSIS — Z79899 Other long term (current) drug therapy: Secondary | ICD-10-CM

## 2023-03-13 LAB — PRO B NATRIURETIC PEPTIDE: NT-Pro BNP: 1381 pg/mL — ABNORMAL HIGH (ref 0–486)

## 2023-03-13 LAB — BASIC METABOLIC PANEL
BUN/Creatinine Ratio: 26 — ABNORMAL HIGH (ref 10–24)
BUN: 29 mg/dL (ref 10–36)
CO2: 27 mmol/L (ref 20–29)
Calcium: 9.2 mg/dL (ref 8.6–10.2)
Chloride: 100 mmol/L (ref 96–106)
Creatinine, Ser: 1.12 mg/dL (ref 0.76–1.27)
Glucose: 89 mg/dL (ref 70–99)
Potassium: 4.1 mmol/L (ref 3.5–5.2)
Sodium: 141 mmol/L (ref 134–144)
eGFR: 62 mL/min/{1.73_m2} (ref >59)

## 2023-03-13 MED ORDER — FUROSEMIDE 20 MG PO TABS: ORAL_TABLET | ORAL | 3 refills | Status: DC

## 2023-03-14 DIAGNOSIS — D333 Benign neoplasm of cranial nerves: Secondary | ICD-10-CM | POA: Diagnosis not present

## 2023-03-14 DIAGNOSIS — H90A21 Sensorineural hearing loss, unilateral, right ear, with restricted hearing on the contralateral side: Secondary | ICD-10-CM | POA: Diagnosis not present

## 2023-03-14 DIAGNOSIS — Z0110 Encounter for examination of ears and hearing without abnormal findings: Secondary | ICD-10-CM | POA: Diagnosis not present

## 2023-03-14 DIAGNOSIS — Z923 Personal history of irradiation: Secondary | ICD-10-CM | POA: Diagnosis not present

## 2023-03-14 DIAGNOSIS — H9311 Tinnitus, right ear: Secondary | ICD-10-CM | POA: Diagnosis not present

## 2023-03-14 DIAGNOSIS — Z9889 Other specified postprocedural states: Secondary | ICD-10-CM | POA: Diagnosis not present

## 2023-03-14 DIAGNOSIS — H903 Sensorineural hearing loss, bilateral: Secondary | ICD-10-CM | POA: Diagnosis not present

## 2023-04-03 ENCOUNTER — Ambulatory Visit: Payer: Medicare Other | Attending: Cardiology

## 2023-04-03 DIAGNOSIS — Z79899 Other long term (current) drug therapy: Secondary | ICD-10-CM | POA: Diagnosis not present

## 2023-04-03 DIAGNOSIS — I351 Nonrheumatic aortic (valve) insufficiency: Secondary | ICD-10-CM

## 2023-04-03 DIAGNOSIS — I10 Essential (primary) hypertension: Secondary | ICD-10-CM

## 2023-04-03 DIAGNOSIS — R600 Localized edema: Secondary | ICD-10-CM | POA: Diagnosis not present

## 2023-04-04 LAB — BASIC METABOLIC PANEL
BUN/Creatinine Ratio: 24 (ref 10–24)
BUN: 25 mg/dL (ref 10–36)
CO2: 27 mmol/L (ref 20–29)
Calcium: 9.1 mg/dL (ref 8.6–10.2)
Chloride: 100 mmol/L (ref 96–106)
Creatinine, Ser: 1.04 mg/dL (ref 0.76–1.27)
Glucose: 79 mg/dL (ref 70–99)
Potassium: 4.2 mmol/L (ref 3.5–5.2)
Sodium: 140 mmol/L (ref 134–144)
eGFR: 68 mL/min/{1.73_m2} (ref >59)

## 2023-04-04 LAB — PRO B NATRIURETIC PEPTIDE: NT-Pro BNP: 1130 pg/mL — ABNORMAL HIGH (ref 0–486)

## 2023-04-05 ENCOUNTER — Telehealth: Payer: Self-pay

## 2023-04-05 NOTE — Telephone Encounter (Signed)
Pt says that he is still has the same edema in his lower extremities.Marland Kitchen it goes up and down... he says his breathing has been fine... he gets tired easy but he does not think outside f his "normal".. he says he does not think he is putting out any added urine ask he would expect from the increased dose of lasix... no signs of UTI..he is taking:  Current dose: Lasix 60 mg alt with 80 mg every other day.

## 2023-04-05 NOTE — Telephone Encounter (Signed)
-----   Message from [place] sent at 04/04/2023  6:10 PM EST ----- Labs   Electrolytes and kidney function are Fluid number is still up some  How is he feeling?   Does he have any SOB ?  What about ankle swelling? Confirm lasix dose he is taking

## 2023-04-09 ENCOUNTER — Encounter: Payer: Self-pay | Admitting: Internal Medicine

## 2023-04-09 ENCOUNTER — Ambulatory Visit: Payer: Medicare Other | Attending: Internal Medicine | Admitting: Internal Medicine

## 2023-04-09 VITALS — BP 132/80 | HR 93 | Ht 74.0 in | Wt 148.8 lb

## 2023-04-09 DIAGNOSIS — I5030 Unspecified diastolic (congestive) heart failure: Secondary | ICD-10-CM

## 2023-04-09 NOTE — Telephone Encounter (Signed)
Bring in for quick visit

## 2023-04-09 NOTE — Telephone Encounter (Signed)
OV 04/09/23

## 2023-04-09 NOTE — Progress Notes (Signed)
Cardiology Office Note   Date:  04/09/2023   ID:  Leonard Wilcox, DOB 19-Oct-1930, MRN [id number]  PCP:  Daisy Floro, MD  Cardiologist:   Dietrich Pates, MD   Pt returns for f/u of LE edema     History of Present Illness: Leonard Wilcox is a 89 y.o. male  with hx of HTN, SVT, mild/mod AI and LE edema     The pt has had elevated BNPs with no LE edema Since early spring periodic labs followed   BNP elevate, lasix adjusted    I last saw the pt April 2024  Tr LE edema  Lungs clear. \ Echo in May 2024 LVEF reported at 45 to 50%   AI moderate On review, I felt LVEF was a little better  IN Aug I saw him and he had no LE edema  Lungs were CTA   Overall volume looked good   Since seen in Aug the pt has felt good  He denies SOB    Notes trace LE edema.  Wears support socks    Denies CP   No dizziness  Outpatient Medications Prior to Visit  Medication Sig Dispense Refill   Ascorbic Acid (VITAMIN C) 500 MG CAPS 1 tablet     B-Complex TABS      carvedilol (COREG) 3.125 MG tablet Take 1 tablet (3.125 mg total) by mouth 2 (two) times daily. 180 tablet 3   Cholecalciferol (VITAMIN D3) 1000 units CAPS Take 1,000 Units by mouth daily in the afternoon. 100 capsule 3   diclofenac Sodium (VOLTAREN) 1 % GEL Apply topically.     furosemide (LASIX) 20 MG tablet Take 3 tablets (60 mg) by mouth every other day alternating with 4 tablets (80 mg) on the opposite days. 315 tablet 3   hydroxychloroquine (PLAQUENIL) 200 MG tablet Take 200 mg by mouth 2 (two) times daily.     loratadine (CLARITIN) 10 MG tablet 1 tablet Orally Once a day     Magnesium 250 MG TABS 1 tablet     Multiple Vitamin (MULTIVITAMIN ADULT) TABS Orally     Multiple Vitamins-Minerals (PRESERVISION AREDS 2) CAPS Take 2 capsules by mouth daily in the afternoon.     Omega-3 Fatty Acids (FISH OIL) 1000 MG CAPS 1 capsule with a meal     potassium chloride (KLOR-CON) 10 MEQ tablet Take 1 tablet (10 mEq total) by mouth daily. 90 tablet 3    ramipril (ALTACE) 10 MG capsule 1 capsule     cephALEXin (KEFLEX) 500 MG capsule Take 500 mg by mouth 3 (three) times daily.     EPINEPHrine 0.3 mg/0.3 mL IJ SOAJ injection See admin instructions.     No facility-administered medications prior to visit.     Allergies:   Amlodipine besylate, Bee venom, Simvastatin, Sulfa antibiotics, Sulfasalazine, Itraconazole, and Other   Past Medical History:  Diagnosis Date   Arthritis     Past Surgical History:  Procedure Laterality Date   BRAIN SURGERY     EYE SURGERY       Social History:  The patient  reports that he has never smoked. He has never used smokeless tobacco. He reports that he does not drink alcohol and does not use drugs.   Family History:  The patient's family history includes Dementia in his mother; Stomach cancer in his father.    ROS:  Please see the history of present illness. All other systems are reviewed and  Negative to the above problem  except as noted.    PHYSICAL EXAM: VS:  BP 132/80   Pulse 93   Ht 6\' 2"  (1.88 m)   Wt 148 lb 12.8 oz (67.5 kg)   SpO2 97%   BMI 19.10 kg/m    GEN: Thin 89 yo in NAD  HEENT: normal  Neck:  JVP not elevated  Cardiac: RRR; no murmurs.,  No signficant  LE   edema  Respiratory:  clear to auscultation  GI: soft, nontender  No hepatomegaly     EKG:  EKG not   done   Echo  May 2024   1. Left ventricular ejection fraction, by estimation, is 45 to 50%. The  left ventricle has mildly decreased function. The left ventricle  demonstrates global hypokinesis. There is mild left ventricular  hypertrophy. Left ventricular diastolic parameters  are indeterminate.   2. Right ventricular systolic function is normal. The right ventricular  size is mildly enlarged.   3. The mitral valve is normal in structure. No evidence of mitral valve  regurgitation. No evidence of mitral stenosis.   4. The aortic valve is calcified. Aortic valve regurgitation is moderate.  Aortic valve  sclerosis/calcification is present, without any evidence of  aortic stenosis. Aortic regurgitation PHT measures 608 msec.   5. Aortic dilatation noted. There is mild dilatation of the ascending  aorta, measuring 41 mm.   6. The inferior vena cava is normal in size with greater than 50%  respiratory variability, suggesting right atrial pressure of 3 mmHg.   Lipid Panel    Component Value Date/Time   CHOL 183 09/14/2022 1538   TRIG 91 09/14/2022 1538   HDL 76 09/14/2022 1538   CHOLHDL 2.4 09/14/2022 1538   LDLCALC 91 09/14/2022 1538      Wt Readings from Last 3 Encounters:  04/09/23 148 lb 12.8 oz (67.5 kg)  01/15/23 157 lb 9.6 oz (71.5 kg)  09/14/22 156 lb 9.6 oz (71 kg)      ASSESSMENT AND PLAN:  1 HFpEF / HFmrEF   Overall volume status appears good   No significant LE edema   Labs over the past severall months, BNP has been  786, 1377, 1629, 1015, 1099, 1381, 1130.   Cr normal Given the pt is comfortable with no SOB and exam looks good, I would keep on current  lasix regimen Avoid salt overa holidays   1  HTN  BP well controled on current regimen    3  AV dz    No murmur on exam   Echo shows moderate AI (May 2024) Keep BP controlled   4  HCM    LDL 106  HDL 73  Trig 59     Follow up in spring    Current medicines are reviewed at length with the patient today.  The patient does not have concerns regarding medicines.    Signed, Dietrich Pates, MD  04/09/2023 3:16 PM    [facility] [address] [place], [place], [place]  [address] Phone: [phone]; Fax: [phone]

## 2023-04-09 NOTE — Patient Instructions (Signed)
Medication Instructions:   *If you need a refill on your cardiac medications before your next appointment, please call your pharmacy*   Lab Work:  If you have labs (blood work) drawn today and your tests are completely normal, you will receive your results only by: MyChart Message (if you have MyChart) OR A paper copy in the mail If you have any lab test that is abnormal or we need to change your treatment, we will call you to review the results.   Testing/Procedures:    Follow-Up: At [facility], you and your health needs are our priority.  As part of our continuing mission to provide you with exceptional heart care, we have created designated Provider Care Teams.  These Care Teams include your primary Cardiologist (physician) and Advanced Practice Providers (APPs -  Physician Assistants and Nurse Practitioners) who all work together to provide you with the care you need, when you need it.  We recommend signing up for the patient portal called "MyChart".  Sign up information is provided on this After Visit Summary.  MyChart is used to connect with patients for Virtual Visits (Telemedicine).  Patients are able to view lab/test results, encounter notes, upcoming appointments, etc.  Non-urgent messages can be sent to your provider as well.   To learn more about what you can do with MyChart, go to [email or web address].    

## 2023-04-22 ENCOUNTER — Other Ambulatory Visit: Payer: Self-pay | Admitting: Internal Medicine

## 2023-05-14 ENCOUNTER — Ambulatory Visit: Payer: Medicare Other | Admitting: Podiatry

## 2023-05-14 ENCOUNTER — Encounter: Payer: Self-pay | Admitting: Podiatry

## 2023-05-14 DIAGNOSIS — M79674 Pain in right toe(s): Secondary | ICD-10-CM | POA: Diagnosis not present

## 2023-05-14 DIAGNOSIS — B351 Tinea unguium: Secondary | ICD-10-CM

## 2023-05-14 DIAGNOSIS — M79675 Pain in left toe(s): Secondary | ICD-10-CM

## 2023-05-16 DIAGNOSIS — G629 Polyneuropathy, unspecified: Secondary | ICD-10-CM | POA: Diagnosis not present

## 2023-05-16 DIAGNOSIS — M1991 Primary osteoarthritis, unspecified site: Secondary | ICD-10-CM | POA: Diagnosis not present

## 2023-05-16 DIAGNOSIS — Z6820 Body mass index (BMI) 20.0-20.9, adult: Secondary | ICD-10-CM | POA: Diagnosis not present

## 2023-05-16 DIAGNOSIS — M353 Polymyalgia rheumatica: Secondary | ICD-10-CM | POA: Diagnosis not present

## 2023-05-16 DIAGNOSIS — M0609 Rheumatoid arthritis without rheumatoid factor, multiple sites: Secondary | ICD-10-CM | POA: Diagnosis not present

## 2023-05-16 DIAGNOSIS — Z79899 Other long term (current) drug therapy: Secondary | ICD-10-CM | POA: Diagnosis not present

## 2023-05-17 ENCOUNTER — Encounter: Payer: Self-pay | Admitting: Internal Medicine

## 2023-05-19 NOTE — Progress Notes (Signed)
  Subjective:  Patient ID: Leonard Wilcox, male    DOB: 1930/11/17,  MRN: [id number]  89 y.o. male presents to clinic with  callus(es) left foot and right foot and painful thick toenails that are difficult to trim. Painful toenails interfere with ambulation. Aggravating factors include wearing enclosed shoe gear. Pain is relieved with periodic professional debridement. Painful calluses are aggravated when weightbearing with and without shoegear. Pain is relieved with periodic professional debridement.  Chief Complaint  Patient presents with   Nail Problem    PATIENT STATES HE CAN'T REMEMBER HE SAW HIS PCP      New problem(s): None   PCP is Tenny Craw, Darlen Round, MD.  Allergies  Allergen Reactions   Amlodipine Besylate Other (See Comments)    Other reaction(s): HA, flushed   Bee Venom     Other reaction(s): anaphylaxis   Simvastatin     Other reaction(s): muscle aches Other reaction(s): muscle aches   Sulfa Antibiotics Other (See Comments)    Allergic to bee sting, carries epi pen with him   Sulfasalazine Other (See Comments)    Allergic to bee sting, carries epi pen with him   Itraconazole Rash and Other (See Comments)    Other reaction(s): does tolerate lamisil/rash   Other Rash    Sporinox Other reaction(s): anaphylaxis    Review of Systems: Negative except as noted in the HPI.   Objective:  Leonard Wilcox is a pleasant 89 y.o. male WD, WN in NAD.Marland Kitchen AAO x 3.  Vascular Examination: Vascular status intact b/l with palpable pedal pulses. CFT immediate b/l. No edema. No pain with calf compression b/l. Skin temperature gradient WNL b/l. No cyanosis or clubbing noted b/l LE.  Neurological Examination: Sensation grossly intact b/l with 10 gram monofilament. Vibratory sensation intact b/l.   Dermatological Examination: Pedal skin with normal turgor, texture and tone b/l. Toenails 1-5 b/l thick, discolored, elongated with subungual debris and pain on dorsal palpation Minimal  hyperkeratos(is/es) noted submet head 2 b/l.  Musculoskeletal Examination: Muscle strength 5/5 to b/l LE. Plantarflexed metatarsal(s) 2nd metatarsal head of both feet.  Radiographs: None  Last A1c:      Latest Ref Rng & Units 09/14/2022    3:38 PM  Hemoglobin A1C  Hemoglobin-A1c 4.8 - 5.6 % 5.9      Assessment:   1. Pain due to onychomycosis of toenails of both feet   2. Callus of foot   3. Pain in both feet    Plan:  -Consent given for treatment as described below: -Examined patient. -Patient to continue soft, supportive shoe gear daily. -Mycotic toenails 1-5 bilaterally were debrided in length and girth with sterile nail nippers and dremel without incident. -Patient/POA to call should there be question/concern in the interim.  Return in about 3 months (around 08/12/2023).  Freddie Breech, DPM      [place] LOCATION: [address], [place] [address]                   Office [phone]   [facility] LOCATION: [address] [place], [place] [address] Office [phone]

## 2023-06-03 ENCOUNTER — Encounter: Payer: Self-pay | Admitting: Internal Medicine

## 2023-06-03 NOTE — Telephone Encounter (Signed)
 Please get labs from Dr Nickola Major office (Liver function)

## 2023-06-04 ENCOUNTER — Encounter: Payer: Self-pay | Admitting: Internal Medicine

## 2023-06-04 NOTE — Telephone Encounter (Signed)
 Per pt:   Yes, I'm still taking the same dosage of furosemide. I alternate daily between 60 and 80mg .

## 2023-06-12 IMAGING — US US EXTREM LOW VENOUS*L*
1 series · 13 of 24 positions shown · non-contrast
Comparison: None.

CLINICAL DATA: Left lower extremity edema.  Evaluate for DVT.



[Series 1: us extrem low venous*left* · 0.06mm/px · 13 of 30 slices shown]
[im 1/30]
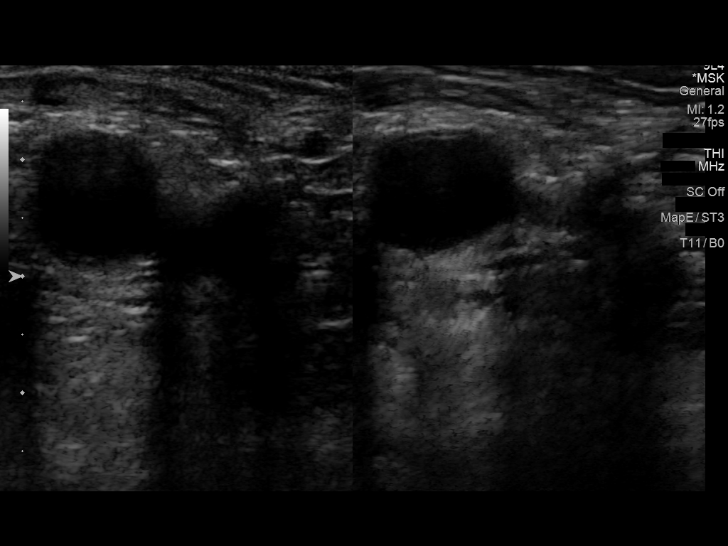
[im 3/30]
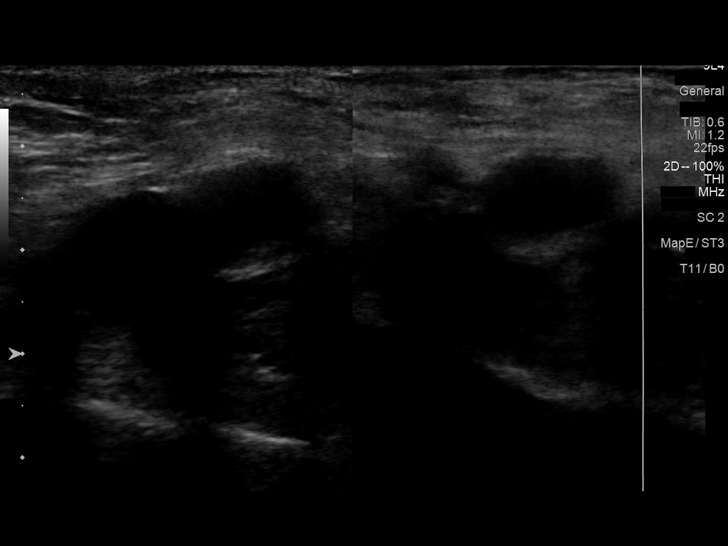
[im 6/30]
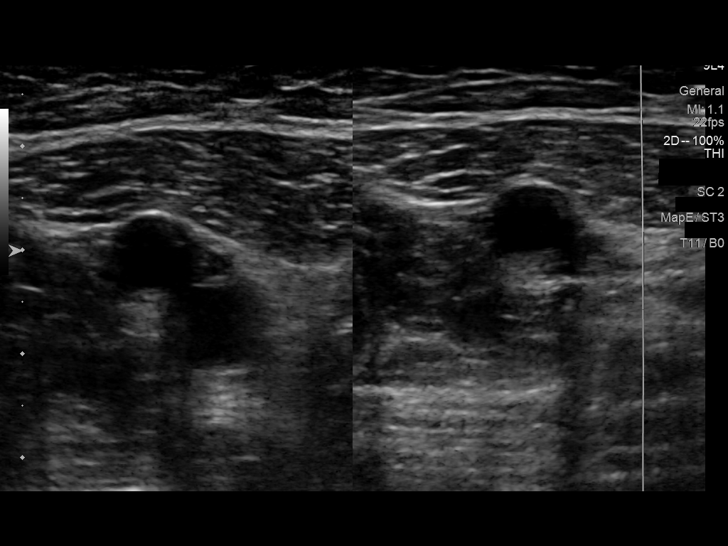
[im 8/30]
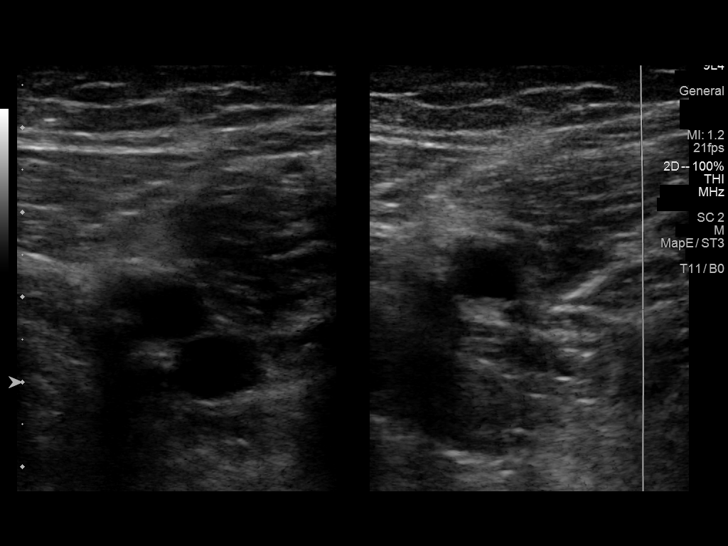
[im 11/30]
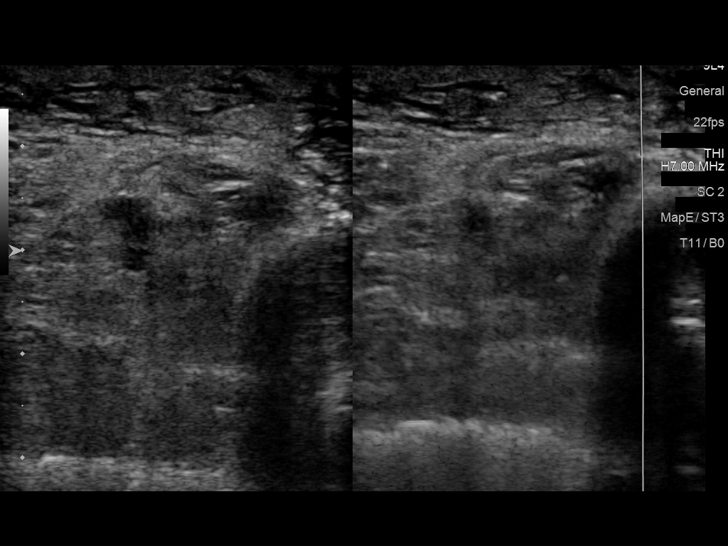
[im 13/30]
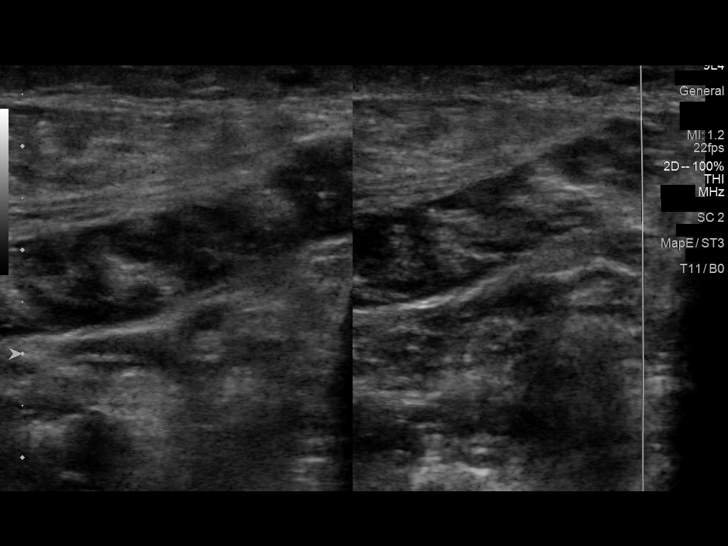
[im 16/30]
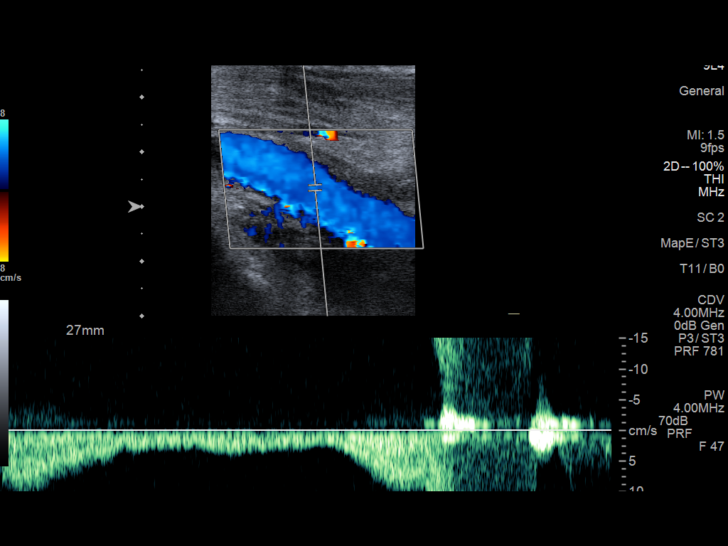
[im 17/30]
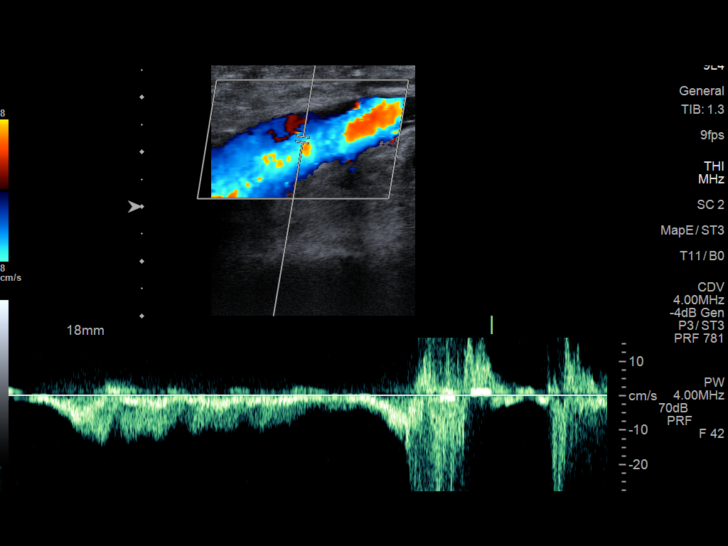
[im 19/30]
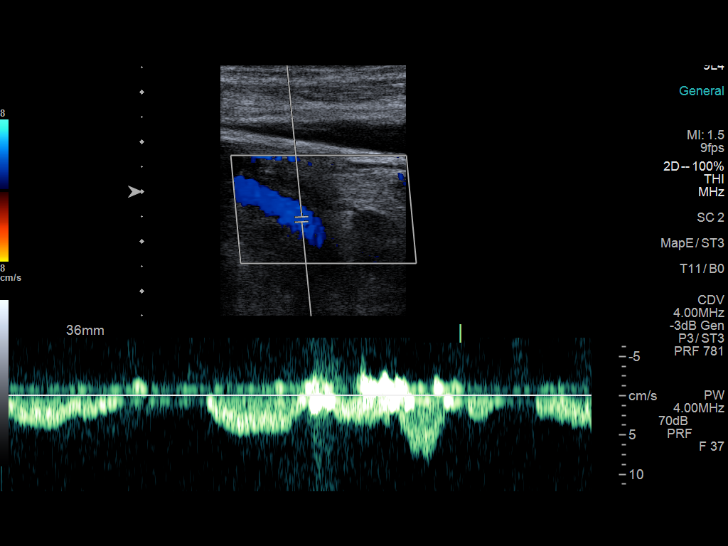
[im 22/30]
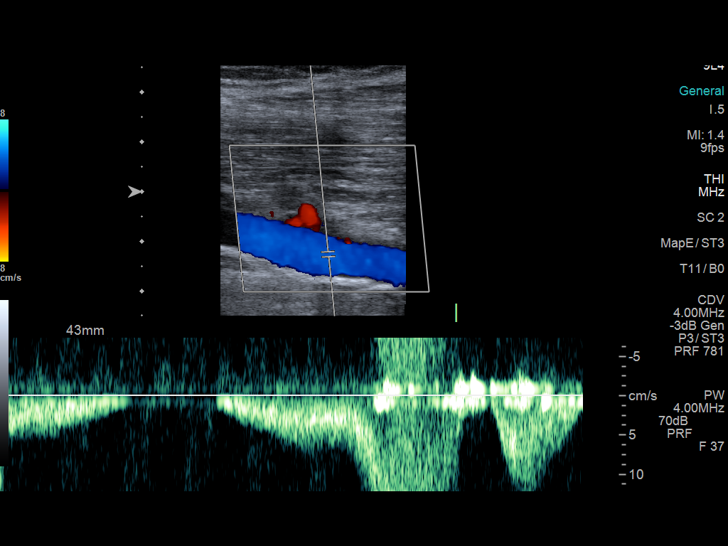
[im 24/30]
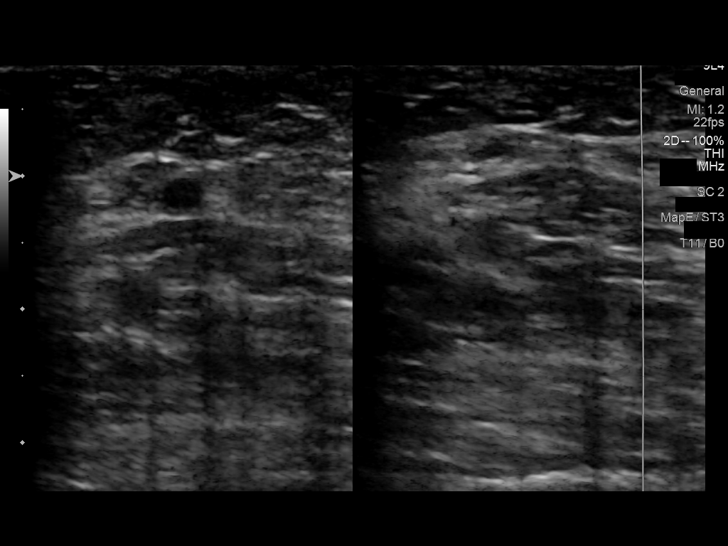
[im 27/30]
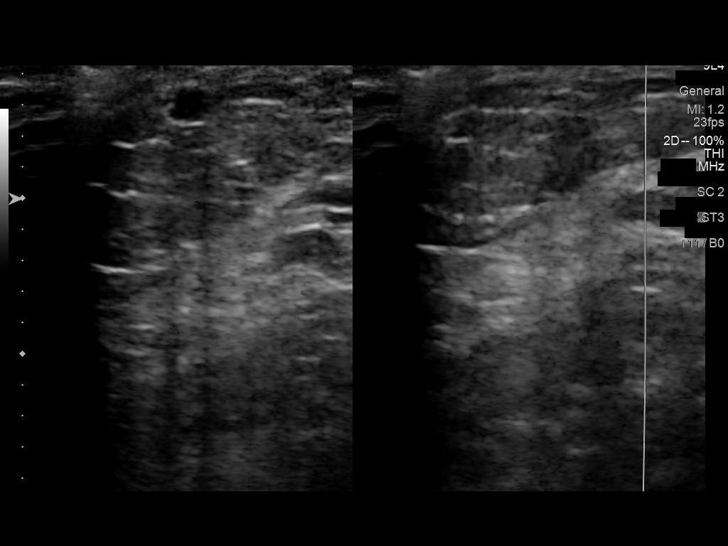
[im 30/30]
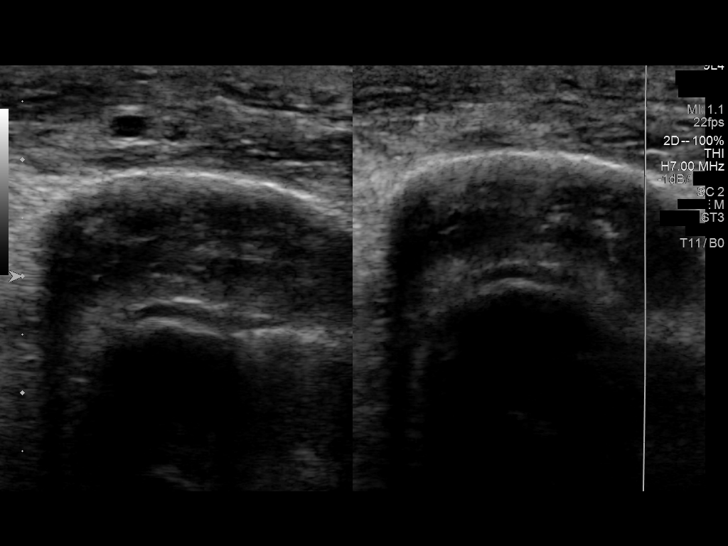

[13 of 24 positions shown; findings below may reference images not displayed]

FINDINGS: Contralateral Common Femoral Vein: Respiratory phasicity is normal
and symmetric with the symptomatic side. No evidence of thrombus.
Normal compressibility.

Common Femoral Vein: No evidence of thrombus. Normal
compressibility, respiratory phasicity and response to augmentation.

Saphenofemoral Junction: No evidence of thrombus. Normal
compressibility and flow on color Doppler imaging.

Profunda Femoral Vein: No evidence of thrombus. Normal
compressibility and flow on color Doppler imaging.

Femoral Vein: No evidence of thrombus. Normal compressibility,
respiratory phasicity and response to augmentation.

Popliteal Vein: No evidence of thrombus. Normal compressibility,
respiratory phasicity and response to augmentation.

Calf Veins: No evidence of thrombus. Normal compressibility and flow
on color Doppler imaging.

Superficial Great Saphenous Vein: No evidence of thrombus. Normal
compressibility.

Venous Reflux:  None.

Other Findings:  None.
IMPRESSION: No evidence of DVT within the left lower extremity.

## 2023-06-13 DIAGNOSIS — R945 Abnormal results of liver function studies: Secondary | ICD-10-CM | POA: Diagnosis not present

## 2023-06-18 ENCOUNTER — Encounter: Payer: Self-pay | Admitting: Internal Medicine

## 2023-06-25 DIAGNOSIS — Z79899 Other long term (current) drug therapy: Secondary | ICD-10-CM | POA: Diagnosis not present

## 2023-06-25 DIAGNOSIS — H353123 Nonexudative age-related macular degeneration, left eye, advanced atrophic without subfoveal involvement: Secondary | ICD-10-CM | POA: Diagnosis not present

## 2023-06-25 DIAGNOSIS — H04123 Dry eye syndrome of bilateral lacrimal glands: Secondary | ICD-10-CM | POA: Diagnosis not present

## 2023-06-25 DIAGNOSIS — H524 Presbyopia: Secondary | ICD-10-CM | POA: Diagnosis not present

## 2023-07-02 NOTE — Telephone Encounter (Signed)
Per Dr Tenny Craw.. she has reviewed the most recent labs and ALT is mildly elevated at 48.   Pt to watch his sugar intake.   No other cardiac med needs to be changed at this time.

## 2023-07-02 NOTE — Progress Notes (Signed)
 No chief complaint on file.   HPI:   Mr. Correll is an 89 year old gentleman I have seen in the distant past and recently.  He worked for [employer], had difficulty with prostatic enlargement and underwent a TURP a number of years ago by Dr. Oliva Oiler. He has done fairly well, although he notes he has some decreased force of stream, some split stream and some dysuria.  He had 1 episode of urinary retention in 2019 and passed a voiding trial.  I have not seen him since that time.  He is followed by Dr. KYM Dasie Gull.  He has a known right vestibular schwannoma that is managed by Dr. Sherill.  He has undergone gamma knife treatment for this.  He also has known rheumatoid arthritis and polymyalgia rheumatica.  He noted he has some decreased force of stream and that had slightly worsened over time.  He noted no blood in his urine or pain with urination.  Postvoid residual was 0, and he underwent an office cystoscopy on 12/06/2022.  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  He is actually voiding well at this point comes in for follow-up.  He comes in for follow-up.  Tumor marker pattern over time has been the following: No results found for: CA199, CEA, CHROMGRNA, AFPTM, CA125, CA2729, CA153, HCG, PSA   Past Medical History:  Diagnosis Date  . Arthritis   . Cataract   . Hearing loss   . Hypertension   . Macular degeneration   . Retinal detachment    OS, RT OD  . Rheumatoid arthritis (CMD)     Allergies  Allergen Reactions  . Amlodipine Besylate Other (See Comments)    Other Reaction(s): HA, flushed  Other reaction(s): HA, flushed  . Bee Sting Kit Other (See Comments)    Allergic to bee sting, carries epi pen with him  . Simvastatin Myalgias    Other reaction(s): muscle aches  Other reaction(s): muscle aches  . Itraconazole Rash and Other (See Comments)    Other Reaction(s): does tolerate lamisil/rash  Other  reaction(s): does tolerate lamisil/rash     Current Outpatient Medications:  .  ascorbic acid, vitamin C, 500 mg cap, 1 tablet, Disp: , Rfl:  .  calcium carbonate-vitamin D3 (Calcium 600 with Vitamin D3) 600 mg-10 mcg (400 unit) chew, Take 2 tablets by mouth Once Daily., Disp: , Rfl:  .  carvediloL  (COREG ) 3.125 mg tablet, Take 3.125 mg by mouth., Disp: , Rfl:  .  cyanocobalamin 2,000 mcg TbER, Take 2,000 mcg by mouth Once Daily., Disp: , Rfl:  .  furosemide  (LASIX ) 20 mg tablet, 80 mg., Disp: , Rfl:  .  hydroxychloroquine (PLAQUENIL) 200 mg tablet, Take 200 mg by mouth 2 (two) times a day., Disp: , Rfl:  .  loratadine (CLARITIN) 10 mg tablet, Take 1 tablet by mouth Once Daily., Disp: , Rfl:  .  magnesium 250 mg tablet, Take 1 tablet by mouth Once Daily., Disp: , Rfl:  .  multivit-min-ferrous fumarate (Complete Multivitamin-Mineral) 9 mg iron/15 mL liqd, 1 tablet, Disp: , Rfl:  .  omega 3-dha-epa-fish oil (FISH OIL) 1,000 mg cap capsule, 1 capsule with a meal, Disp: , Rfl:  .  potassium chloride  (KLOR-CON ) 10 mEq ER tablet, Take 10 mEq by mouth daily., Disp: , Rfl:  .  ramipriL (ALTACE) 10 mg capsule, Take 10 mg by mouth Once Daily., Disp: , Rfl:  .  vit C-E-cupric-zinc-lutein (PreserVision Lutein)  226-90-0.8-5 mg capsule, Take 1 tablet by mouth 2 (two) times a day., Disp: , Rfl:   Family History  Problem Relation Name Age of Onset  . Cancer Mother    . Hypertension Mother    . Hearing loss Mother    . Cancer Father    . Hearing loss Brother      Social History   Socioeconomic History  . Marital status: Widowed    Spouse name: None  . Number of children: None  . Years of education: None  . Highest education level: None  Occupational History  . None  Tobacco Use  . Smoking status: Never  . Smokeless tobacco: Never  Substance and Sexual Activity  . Alcohol use: No  . Drug use: No  . Sexual activity: None  Other Topics Concern  . None  Social History Narrative  . None    Social Drivers of Health   Food Insecurity: Low Risk  (07/04/2023)   Food vital sign   . Within the past 12 months, you worried that your food would run out before you got money to buy more: Never true   . Within the past 12 months, the food you bought just didn't last and you didn't have money to get more: Never true  Transportation Needs: No Transportation Needs (07/04/2023)   Transportation   . In the past 12 months, has lack of reliable transportation kept you from medical appointments, meetings, work or from getting things needed for daily living? : No  Safety: Low Risk  (07/04/2023)   Safety   . How often does anyone, including family and friends, physically hurt you?: Never   . How often does anyone, including family and friends, insult or talk down to you?: Never   . How often does anyone, including family and friends, threaten you with harm?: Never   . How often does anyone, including family and friends, scream or curse at you?: Never  Living Situation: Low Risk  (07/04/2023)   Living Situation   . What is your living situation today?: I have a steady place to live   . Think about the place you live. Do you have problems with any of the following? Choose all that apply:: None/None on this list    Patient Active Problem List   Diagnosis Date Noted  Date Diagnosed  . 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 Status post gamma knife treatment 08/11/2020   . Sensorineural hearing loss (SNHL) of right ear with restricted hearing of left ear 02/05/2020   . Asymmetric SNHL (sensorineural hearing loss) 02/05/2020   . Imbalance 02/05/2020   . Unilateral vestibular schwannoma (HCC) 02/05/2019     Overview Note:    R VS measuring 13x10x44mm on MRI brain scan 11-01-18    . Bladder neck obstruction 10/25/2017     Overview Note:    Added automatically from request for surgery [phone]    . Dysuria 09/22/2017   . Benign prostatic hyperplasia 09/22/2017   . Ongoing use of  possibly toxic medication 07/26/2016   . Combined forms of age-related cataract of right eye 07/26/2016   . Early stage nonexudative age-related macular degeneration of both eyes 07/21/2015   . Status post intraocular lens implant 06/25/2013   . Retinal detachment of left eye with multiple breaks 06/21/2011   . Horseshoe retinal tear of right eye 06/21/2011   . Presence of intraocular lens 06/21/2011   . Epiretinal membrane, left 06/21/2011   . Nuclear cataract  06/21/2011     Resolved Problems  No resolved problems to display.    ROS:A comprehensive review of systems was negative.  EXAM:  Vitals:   07/04/23 1036  BP: 125/86  Pulse:   Temp:   SpO2:    GENERAL: Vitals reviewed and listed below, alert, oriented, appears well hydrated and in no acute distress HEENT: WNL Neck: Norm ROM LUNGS: Clear to auscultation bilaterally, no wheezes, rales or rhonchi, good air movement CV: HRRR, no peripheral edema ABDOMEN: Soft, non-tender without masses or organomegaly FLANK: GEN RIGHT/LEFT/BIL: does not CVA tenderness noted to palpation EXTREMITIES: Warm and well perfused Neuro:  Intact Skin:  No significant lesions noted PSYCH: Pleasant and cooperative, no obvious depression or anxiety RECTAL: Rectal vault no lesions GU: Prostate 40 grams and smooth.  Penis and testicles normal.  Results for orders placed or performed in visit on 11/01/22  POC Urinalysis Auto without Microscopic   Collection Time: 11/01/22  9:28 AM  Result Value Ref Range   Color, Urine Yellow Yellow   Clarity, Urine Clear Clear   Glucose, Urine Negative Negative mg/dL   Bilirubin, Urine Negative Negative   Ketones, Urine Negative Negative mg/dL   Specific Gravity, Urine 1.015 1.010, 1.015, 1.020, 1.025   Blood, Urine Trace-intact (A) Negative   pH, Urine 6.0 5.0, 5.5, 6.0, 6.5, 7.0, 7.5, 8.0   Protein, Urine Negative Negative mg/dL   Urobilinogen, Urine 0.2 <2.0 mg/dL   Nitrite, Urine Negative Negative    Leukocyte Esterase, Urine Negative Negative   Kit/Device Lot # [id number]    Kit/Device Expiration Date 07/2023     Assessment:   Patient Active Problem List  Diagnosis  . Retinal detachment of left eye with multiple breaks  . Horseshoe retinal tear of right eye  . Presence of intraocular lens  . Epiretinal membrane, left  . Nuclear cataract  . Status post intraocular lens implant  . Early stage nonexudative age-related macular degeneration of both eyes  . Ongoing use of possibly toxic medication  . Combined forms of age-related cataract of right eye  . Dysuria  . Benign prostatic hyperplasia  . Bladder neck obstruction  . Unilateral vestibular schwannoma (HCC)  . Sensorineural hearing loss (SNHL) of right ear with restricted hearing of left ear  . Asymmetric SNHL (sensorineural hearing loss)  . Imbalance  . Status post gamma knife treatment  . Post-traumatic bulbous urethral stricture    Plan:  Diagnoses and all orders for this visit:  Benign prostatic hyperplasia with urinary obstruction  Post-traumatic bulbous urethral stricture    Risk Assessment:  Overall Crispin is doing well.  Will set him up to see us  on a yearly basis no call if he has problems in the interim.             Return in about 1 year (around 07/03/2024).    Electronically signed by: Tanda LITTIE Nicholaus DOUGLAS, MD 07/04/2023 11:08 AM

## 2023-07-04 DIAGNOSIS — N35011 Post-traumatic bulbous urethral stricture: Secondary | ICD-10-CM | POA: Diagnosis not present

## 2023-07-04 DIAGNOSIS — N138 Other obstructive and reflux uropathy: Secondary | ICD-10-CM | POA: Diagnosis not present

## 2023-07-26 ENCOUNTER — Other Ambulatory Visit: Payer: Self-pay | Admitting: Internal Medicine

## 2023-09-25 ENCOUNTER — Encounter: Payer: Self-pay | Admitting: Podiatry

## 2023-09-25 ENCOUNTER — Ambulatory Visit: Payer: Medicare Other | Admitting: Podiatry

## 2023-09-25 DIAGNOSIS — B351 Tinea unguium: Secondary | ICD-10-CM

## 2023-09-25 DIAGNOSIS — M79674 Pain in right toe(s): Secondary | ICD-10-CM | POA: Diagnosis not present

## 2023-09-25 DIAGNOSIS — G629 Polyneuropathy, unspecified: Secondary | ICD-10-CM

## 2023-09-25 DIAGNOSIS — M79672 Pain in left foot: Secondary | ICD-10-CM

## 2023-09-25 DIAGNOSIS — M79675 Pain in left toe(s): Secondary | ICD-10-CM | POA: Diagnosis not present

## 2023-09-25 DIAGNOSIS — M79671 Pain in right foot: Secondary | ICD-10-CM

## 2023-09-25 NOTE — Progress Notes (Signed)
 This patient returns to my office for at risk foot care.  This patient requires this care by a professional since this patient will be at risk due to having neuropathy.  This patient is unable to cut nails himself since the patient cannot reach his nails.These nails are painful walking and wearing shoes.  This patient presents for at risk foot care today.  General Appearance  Alert, conversant and in no acute stress.  Vascular  Dorsalis pedis and posterior tibial  pulses are palpable  bilaterally.  Capillary return is within normal limits  bilaterally. Temperature is within normal limits  bilaterally.  Neurologic  Senn-Weinstein monofilament wire test within normal limits  bilaterally. Muscle power within normal limits bilaterally.  Nails Thick disfigured discolored nails with subungual debris  from hallux to fifth toes bilaterally. No evidence of bacterial infection or drainage bilaterally.  Orthopedic  No limitations of motion  feet .  No crepitus or effusions noted.  No bony pathology or digital deformities noted.  Skin  normotropic skin with no porokeratosis noted bilaterally.  No signs of infections or ulcers noted.     Onychomycosis  Pain in right toes  Pain in left toes  Consent was obtained for treatment procedures.   Mechanical debridement of nails 1-5  bilaterally performed with a nail nipper.  Filed with dremel without incident.    Return office visit    3 months                  Told patient to return for periodic foot care and evaluation due to potential at risk complications.   Ruffin Cotton DPM

## 2023-10-24 DIAGNOSIS — H90A21 Sensorineural hearing loss, unilateral, right ear, with restricted hearing on the contralateral side: Secondary | ICD-10-CM | POA: Diagnosis not present

## 2023-10-24 DIAGNOSIS — D333 Benign neoplasm of cranial nerves: Secondary | ICD-10-CM | POA: Diagnosis not present

## 2023-11-07 DIAGNOSIS — H33022 Retinal detachment with multiple breaks, left eye: Secondary | ICD-10-CM | POA: Diagnosis not present

## 2023-11-07 DIAGNOSIS — Z961 Presence of intraocular lens: Secondary | ICD-10-CM | POA: Diagnosis not present

## 2023-11-07 DIAGNOSIS — H33311 Horseshoe tear of retina without detachment, right eye: Secondary | ICD-10-CM | POA: Diagnosis not present

## 2023-11-07 DIAGNOSIS — H35372 Puckering of macula, left eye: Secondary | ICD-10-CM | POA: Diagnosis not present

## 2023-11-07 DIAGNOSIS — H353132 Nonexudative age-related macular degeneration, bilateral, intermediate dry stage: Secondary | ICD-10-CM | POA: Diagnosis not present

## 2023-11-14 DIAGNOSIS — M1991 Primary osteoarthritis, unspecified site: Secondary | ICD-10-CM | POA: Diagnosis not present

## 2023-11-14 DIAGNOSIS — Z79899 Other long term (current) drug therapy: Secondary | ICD-10-CM | POA: Diagnosis not present

## 2023-11-14 DIAGNOSIS — M0609 Rheumatoid arthritis without rheumatoid factor, multiple sites: Secondary | ICD-10-CM | POA: Diagnosis not present

## 2023-11-14 DIAGNOSIS — G629 Polyneuropathy, unspecified: Secondary | ICD-10-CM | POA: Diagnosis not present

## 2023-11-14 DIAGNOSIS — Z681 Body mass index (BMI) 19 or less, adult: Secondary | ICD-10-CM | POA: Diagnosis not present

## 2023-11-14 DIAGNOSIS — M353 Polymyalgia rheumatica: Secondary | ICD-10-CM | POA: Diagnosis not present

## 2023-11-18 ENCOUNTER — Other Ambulatory Visit: Payer: Self-pay | Admitting: Rheumatology

## 2023-11-18 DIAGNOSIS — R7989 Other specified abnormal findings of blood chemistry: Secondary | ICD-10-CM

## 2023-11-20 ENCOUNTER — Ambulatory Visit
Admission: RE | Admit: 2023-11-20 | Discharge: 2023-11-20 | Disposition: A | Discharge status: Home / Self Care | Source: Ambulatory Visit | Attending: Rheumatology | Admitting: Rheumatology

## 2023-11-20 DIAGNOSIS — K7689 Other specified diseases of liver: Secondary | ICD-10-CM | POA: Diagnosis not present

## 2023-11-20 DIAGNOSIS — N281 Cyst of kidney, acquired: Secondary | ICD-10-CM | POA: Diagnosis not present

## 2023-11-20 DIAGNOSIS — R7989 Other specified abnormal findings of blood chemistry: Secondary | ICD-10-CM

## 2023-11-26 ENCOUNTER — Other Ambulatory Visit: Payer: Self-pay | Admitting: Rheumatology

## 2023-11-26 DIAGNOSIS — K7689 Other specified diseases of liver: Secondary | ICD-10-CM

## 2023-12-18 DIAGNOSIS — E7800 Pure hypercholesterolemia, unspecified: Secondary | ICD-10-CM | POA: Diagnosis not present

## 2023-12-18 DIAGNOSIS — Z0000 Encounter for general adult medical examination without abnormal findings: Secondary | ICD-10-CM | POA: Diagnosis not present

## 2023-12-26 ENCOUNTER — Ambulatory Visit
Admission: RE | Admit: 2023-12-26 | Discharge: 2023-12-26 | Disposition: A | Discharge status: Home / Self Care | Source: Ambulatory Visit | Attending: Rheumatology | Admitting: Rheumatology

## 2023-12-26 DIAGNOSIS — K7689 Other specified diseases of liver: Secondary | ICD-10-CM

## 2024-01-01 ENCOUNTER — Ambulatory Visit: Admitting: Podiatry

## 2024-01-01 ENCOUNTER — Encounter: Payer: Self-pay | Admitting: Podiatry

## 2024-01-01 DIAGNOSIS — M79675 Pain in left toe(s): Secondary | ICD-10-CM | POA: Diagnosis not present

## 2024-01-01 DIAGNOSIS — S90221A Contusion of right lesser toe(s) with damage to nail, initial encounter: Secondary | ICD-10-CM

## 2024-01-01 DIAGNOSIS — M79674 Pain in right toe(s): Secondary | ICD-10-CM

## 2024-01-01 DIAGNOSIS — B351 Tinea unguium: Secondary | ICD-10-CM | POA: Diagnosis not present

## 2024-01-05 ENCOUNTER — Encounter: Payer: Self-pay | Admitting: Podiatry

## 2024-01-05 NOTE — Progress Notes (Signed)
 Subjective:  Patient ID: Leonard Wilcox, male    DOB: 12-08-1930,  MRN: [id number]  Leonard Wilcox presents to clinic today for painful mycotic toenails x 10 which interfere with daily activities. Pain is relieved with periodic professional debridement.  Chief Complaint  Patient presents with   RFC     RFC Non diabetic toenail trim and callus care.SABRA LOV with PCP 11/2023.   New problem(s): Patient states he has noticed blood on his sock of his left foot. He cannot recall any injury, but states he likes to dance and he hops quite a bit when dancing.   PCP is [name], MD. ARNETTA 12/11/2023.  Allergies  Allergen Reactions   Amlodipine Besylate Other (See Comments)    Other reaction(s): HA, flushed   Bee Venom     Other reaction(s): anaphylaxis   Simvastatin     Other reaction(s): muscle aches Other reaction(s): muscle aches   Sulfa Antibiotics Other (See Comments)    Allergic to bee sting, carries epi pen with him   Sulfasalazine Other (See Comments)    Allergic to bee sting, carries epi pen with him   Itraconazole Rash and Other (See Comments)    Other reaction(s): does tolerate lamisil/rash   Other Rash    Sporinox Other reaction(s): anaphylaxis    Review of Systems: Negative except as noted in the HPI.  Objective: No changes noted in today's physical examination. There were no vitals filed for this visit. Leonard Wilcox is a pleasant 89 y.o. male WD, WN in NAD. AAO x 3.  Vascular Examination: Capillary refill time immediate b/l. Vascular status intact b/l with palpable pedal pulses. Pedal hair absent b/l. No pain with calf compression b/l. Skin temperature gradient WNL b/l. No cyanosis or clubbing b/l. No ischemia or gangrene noted b/l.   Neurological Examination: Sensation grossly intact b/l with 10 gram monofilament. Vibratory sensation intact b/l.   Dermatological Examination: Evidence of subacute subungual granuloma. No erythema, no edema, no drainage,  no fluctuance.     Pedal skin with normal turgor, texture and tone b/l.  No open wounds. No interdigital macerations.   Toenails 1-5 b/l thick, discolored, elongated with subungual debris and pain on dorsal palpation.   No hyperkeratotic nor porokeratotic lesions.  Musculoskeletal Examination: Muscle strength 5/5 to all lower extremity muscle groups bilaterally. Plantarflexed metatarsal(s) 2nd metatarsal head b/l lower extremities.. No pain, crepitus or joint limitation noted with ROM b/l LE.  Patient ambulates independently without assistive aids.  Radiographs: None  Assessment/Plan: 1. Pain due to onychomycosis of toenails of both feet   2. Subungual contusion of toe of right foot, initial encounter   Patient was evaluated and treated. All patient's and/or POA's questions/concerns addressed on today's visit. Left great toe cleansed with alcohol. Betadine solution and band-aid applied. He was advised to leave it open to air when home. Toenails 1-5 debrided in length and girth without incident. Continue soft, supportive shoe gear daily. Report any pedal injuries to medical professional. Call office if there are any questions/concerns. -Patient/POA to call should there be question/concern in the interim.   Return in about 3 months (around 04/02/2024).  Leonard Wilcox, DPM      [place] LOCATION: [address]  [place], [place] [address]                   Office [phone]   [facility] LOCATION: [address] [place], [place] [address] Office [phone]

## 2024-01-29 ENCOUNTER — Ambulatory Visit
Admission: RE | Admit: 2024-01-29 | Discharge: 2024-01-29 | Disposition: A | Discharge status: Home / Self Care | Source: Ambulatory Visit | Attending: Rheumatology | Admitting: Rheumatology

## 2024-01-29 DIAGNOSIS — K7689 Other specified diseases of liver: Secondary | ICD-10-CM | POA: Diagnosis not present

## 2024-01-29 MED ORDER — GADOPICLENOL 0.5 MMOL/ML IV SOLN: 7.0000 mL | Freq: Once | INTRAVENOUS | Status: DC | PRN

## 2024-01-29 MED ORDER — GADOPICLENOL 0.5 MMOL/ML IV SOLN
6.0000 mL | Freq: Once | INTRAVENOUS | Status: AC | PRN
  Administered 2024-01-29: 6 mL via INTRAVENOUS

## 2024-02-20 ENCOUNTER — Other Ambulatory Visit: Payer: Self-pay | Admitting: Internal Medicine

## 2024-04-09 ENCOUNTER — Other Ambulatory Visit: Payer: Self-pay | Admitting: Internal Medicine

## 2024-04-13 ENCOUNTER — Encounter: Payer: Self-pay | Admitting: Internal Medicine

## 2024-04-13 ENCOUNTER — Other Ambulatory Visit: Payer: Self-pay | Admitting: Internal Medicine

## 2024-04-14 ENCOUNTER — Other Ambulatory Visit: Payer: Self-pay | Admitting: Internal Medicine

## 2024-04-16 ENCOUNTER — Other Ambulatory Visit: Payer: Self-pay | Admitting: Internal Medicine

## 2024-04-21 ENCOUNTER — Ambulatory Visit: Admitting: Podiatry

## 2024-04-21 DIAGNOSIS — B351 Tinea unguium: Secondary | ICD-10-CM

## 2024-04-21 DIAGNOSIS — M79675 Pain in left toe(s): Secondary | ICD-10-CM

## 2024-04-21 DIAGNOSIS — M79674 Pain in right toe(s): Secondary | ICD-10-CM

## 2024-04-21 DIAGNOSIS — S90221A Contusion of right lesser toe(s) with damage to nail, initial encounter: Secondary | ICD-10-CM | POA: Diagnosis not present

## 2024-04-21 NOTE — Progress Notes (Signed)
 Subjective:  Patient ID: Leonard Wilcox, male    DOB: 1931-04-19,  MRN: [id number]  Leonard Wilcox presents to clinic today for preventative diabetic foot care for painful elongated mycotic toenails 1-5 bilaterally which are tender when wearing enclosed shoe gear. Pain is relieved with periodic professional debridement. He states his left great toe is still bleeding underneath the toenail. Chief Complaint  Patient presents with   RFC    RFC L great to nail has some blood underneath.  not diabetic.  [name], MD (PCP),     New problem(s): None.   PCP is [name], MD.  Allergies  Allergen Reactions   Amlodipine Besylate Other (See Comments)    Other reaction(s): HA, flushed   Bee Venom     Other reaction(s): anaphylaxis   Simvastatin     Other reaction(s): muscle aches Other reaction(s): muscle aches   Sulfa Antibiotics Other (See Comments)    Allergic to bee sting, carries epi pen with him   Sulfasalazine Other (See Comments)    Allergic to bee sting, carries epi pen with him   Itraconazole Rash and Other (See Comments)    Other reaction(s): does tolerate lamisil/rash   Other Rash    Sporinox Other reaction(s): anaphylaxis    Review of Systems: Negative except as noted in the HPI.  Objective:  There were no vitals filed for this visit. Leonard Wilcox is a pleasant 89 y.o. male WD, WN in NAD. AAO x 3.  Vascular Examination: Capillary refill time immediate b/l. Vascular status intact b/l with palpable pedal pulses. Pedal hair absent b/l. No pain with calf compression b/l. Skin temperature gradient WNL b/l. No cyanosis or clubbing b/l. No ischemia or gangrene noted b/l.   Neurological Examination: Sensation grossly intact b/l with 10 gram monofilament. Vibratory sensation intact b/l.   Dermatological Examination: Subungual  granuloma noted left great toe with dried heme noted distally under nail plate.  Pedal skin with normal turgor, texture  and tone b/l.  No open wounds. No interdigital macerations.   Toenails right great toe and 2-5 b/l thick, discolored, elongated with subungual debris and pain on dorsal palpation.   No hyperkeratotic nor porokeratotic lesions.  Musculoskeletal Examination: Muscle strength 5/5 to all lower extremity muscle groups bilaterally. Plantarflexed metatarsal(s) 2nd metatarsal head b/l lower extremities.. No pain, crepitus or joint limitation noted with ROM b/l LE.  Patient ambulates independently without assistive aids.  Radiographs: None Assessment/Plan: 1. Pain due to onychomycosis of toenails of both feet   2. Subungual contusion of toe of right foot, initial encounter   Consent given for treatment. Patient examined. All patient's and/or POA's questions/concerns addressed on today's visit. Mycotic toenails right great toe and 2-5 b/l debrided in length and girth without incident. Debrided left great toenail to expose subungual granuloma. Digit cleansed with alcohol. Silver nitrate applied to granuloma. Betadine solution and light dressing applied. He is to keep dry. He may let it get air when he is sitting up. He may follow up with Dr. Silva in 2 weeks for left great toe. Continue soft, supportive shoe gear daily. Report any pedal injuries to medical professional. Call office if there are any questions/concerns.  Return in about 3 months (around 07/22/2024).  Leonard Wilcox, DPM      [place] LOCATION: [address]  [place], [place] [address]                   Office [phone]   [facility] LOCATION: [address] [place], [place] [address] Office [phone]

## 2024-04-26 ENCOUNTER — Encounter: Payer: Self-pay | Admitting: Podiatry

## 2024-05-05 ENCOUNTER — Ambulatory Visit: Admitting: Podiatry

## 2024-05-05 DIAGNOSIS — L929 Granulomatous disorder of the skin and subcutaneous tissue, unspecified: Secondary | ICD-10-CM | POA: Diagnosis not present

## 2024-05-06 NOTE — Progress Notes (Deleted)
  Pt is  no show for appt  

## 2024-05-07 ENCOUNTER — Ambulatory Visit: Admitting: Internal Medicine

## 2024-05-09 NOTE — Progress Notes (Signed)
°  Subjective:  Patient ID: Leonard Wilcox, male    DOB: 1930-06-27,  MRN: [id number]  Chief Complaint  Patient presents with   Nail Problem    RM 4 Schedule with in 2 weeks with Dr. Lamount or Dr. Silva for subungual granuloma left great toe nail bed    89 y.o. male presents with the above complaint. History confirmed with patient. He notes it doing well not having any drainage  Objective:  Physical Exam: Nail bed and granuloma has healed, no signs of infection no active drainage, good cap fill time  Assessment:   1. Granuloma of great toe      Plan:  Patient was evaluated and treated and all questions answered.  Granuolma well healed and resolved, advised he cont neosporin w/o bandage for next week and leave OTA after, return to see me PRN   Return if symptoms worsen or fail to improve.

## 2024-05-13 ENCOUNTER — Other Ambulatory Visit: Payer: Self-pay | Admitting: Internal Medicine

## 2024-05-14 ENCOUNTER — Other Ambulatory Visit: Payer: Self-pay | Admitting: Internal Medicine

## 2024-05-19 ENCOUNTER — Encounter: Payer: Self-pay | Admitting: Internal Medicine

## 2024-06-12 ENCOUNTER — Encounter: Payer: Self-pay | Admitting: Physician Assistant

## 2024-06-12 ENCOUNTER — Ambulatory Visit: Admitting: Physician Assistant

## 2024-06-12 VITALS — BP 139/91 | HR 82 | Ht 74.0 in | Wt 155.0 lb

## 2024-06-12 DIAGNOSIS — I351 Nonrheumatic aortic (valve) insufficiency: Secondary | ICD-10-CM

## 2024-06-12 DIAGNOSIS — I10 Essential (primary) hypertension: Secondary | ICD-10-CM

## 2024-06-12 DIAGNOSIS — Z79899 Other long term (current) drug therapy: Secondary | ICD-10-CM

## 2024-06-12 DIAGNOSIS — I5020 Unspecified systolic (congestive) heart failure: Secondary | ICD-10-CM

## 2024-06-12 DIAGNOSIS — R600 Localized edema: Secondary | ICD-10-CM | POA: Diagnosis not present

## 2024-06-12 DIAGNOSIS — I5021 Acute systolic (congestive) heart failure: Secondary | ICD-10-CM

## 2024-06-12 NOTE — Progress Notes (Signed)
 " Cardiology Office Note   Date:  06/12/2024  ID:  Treylen L Federici, DOB Feb 04, 1931, MRN [id number] PCP: [name], MD  [facility] Providers Cardiologist:  Vina Okey, MD   History of Present Illness Leonard Wilcox is a 89 y.o. male with a past medical history of hypertension, SVT, mild to moderate AI and lower extremity edema here for follow-up appointment.  He had mild elevated BNP's with no lower extremity edema.  Since early spring 2024 he had periodic labs followed and his BNP was elevated and his Lasix  was adjusted.  April 2024 his lungs were clear but he had trace lower extremity edema.  Echo 2024 in May showed LVEF 45 to 50%, moderate AI it was felt that the LVEF is a little better in August.  At that time no lower extremity edema and volume looks good.  He was last seen November 2024 he was doing well without any shortness of breath.  Trace lower extremity edema and wears support socks.  No dizziness or chest pain at that time.  Today, he presents with a hx of aortic valve disease and mildly decreased heart pump function for cardiovascular evaluation.  He is concerned about his mildly decreased heart pump function, especially given his younger brothers heart failure. An echocardiogram in 2024 showed an ejection fraction of 45-50% with a moderate leaky aortic valve. He denies chest pain or shortness of breath. He has mild chronic swelling of the left leg and uses daily compression socks. He has neuropathy that makes it harder to assess leg swelling.  His home blood pressure is usually normal or slightly low, around 130/80-90.  His LDL was 98 last summer. He has a calcified aortic valve and is not on cholesterol medication other than fish oil. He previously had muscle aches with simvastatin.  Reports no chest pain, pressure, or tightness. No edema, orthopnea, PND. Reports no palpitations.   Discussed the use of AI scribe software for clinical note transcription  with the patient, who gave verbal consent to proceed.   ROS: pertinent ROS in HPI  Studies Reviewed EKG Interpretation Date/Time:  Friday June 12 2024 10:29:20 EST Ventricular Rate:  85 PR Interval:  188 QRS Duration:  94 QT Interval:  428 QTC Calculation: 509 R Axis:   10  Text Interpretation: Sinus rhythm with Premature atrial complexes with Abberant conduction Possible Left atrial enlargement Left ventricular hypertrophy ( Sokolow-Lyon , Cornell product , Romhilt-Estes ) Nonspecific T wave abnormality Prolonged QT When compared with ECG of 15-Jan-2023 09:13, Abberant conduction is now Present Non-specific change in ST segment in Inferior leads T wave inversion no longer evident in Inferior leads T wave inversion no longer evident in Lateral leads Confirmed by Lucien Blanc [phone]) on 06/12/2024 10:55:01 AM     Echocardiogram 10/11/2022  IMPRESSIONS     1. Left ventricular ejection fraction, by estimation, is 45 to 50%. The  left ventricle has mildly decreased function. The left ventricle  demonstrates global hypokinesis. There is mild left ventricular  hypertrophy. Left ventricular diastolic parameters  are indeterminate.   2. Right ventricular systolic function is normal. The right ventricular  size is mildly enlarged.   3. The mitral valve is normal in structure. No evidence of mitral valve  regurgitation. No evidence of mitral stenosis.   4. The aortic valve is calcified. Aortic valve regurgitation is moderate.  Aortic valve sclerosis/calcification is present, without any evidence of  aortic stenosis. Aortic regurgitation PHT measures 608 msec.  5. Aortic dilatation noted. There is mild dilatation of the ascending  aorta, measuring 41 mm.   6. The inferior vena cava is normal in size with greater than 50%  respiratory variability, suggesting right atrial pressure of 3 mmHg.   FINDINGS   Left Ventricle: Left ventricular ejection fraction, by estimation, is 45  to 50%.  The left ventricle has mildly decreased function. The left  ventricle demonstrates global hypokinesis. The left ventricular internal  cavity size was normal in size. There is   mild left ventricular hypertrophy. Left ventricular diastolic parameters  are indeterminate.   Right Ventricle: The right ventricular size is mildly enlarged. No  increase in right ventricular wall thickness. Right ventricular systolic  function is normal.   Left Atrium: Left atrial size was normal in size.   Right Atrium: Right atrial size was normal in size.   Pericardium: There is no evidence of pericardial effusion.   Mitral Valve: The mitral valve is normal in structure. No evidence of  mitral valve regurgitation. No evidence of mitral valve stenosis.   Tricuspid Valve: The tricuspid valve is normal in structure. Tricuspid  valve regurgitation is not demonstrated. No evidence of tricuspid  stenosis.   Aortic Valve: The aortic valve is calcified. Aortic valve regurgitation is  moderate. Aortic regurgitation PHT measures 608 msec. Aortic valve  sclerosis/calcification is present, without any evidence of aortic  stenosis.   Pulmonic Valve: The pulmonic valve was normal in structure. Pulmonic valve  regurgitation is not visualized. No evidence of pulmonic stenosis.   Aorta: Aortic dilatation noted. There is mild dilatation of the ascending  aorta, measuring 41 mm.   Venous: The inferior vena cava is normal in size with greater than 50%  respiratory variability, suggesting right atrial pressure of 3 mmHg.   IAS/Shunts: No atrial level shunt detected by color flow Doppler.         Physical Exam VS:  BP (!) 139/91   Pulse 82   Ht 6' 2 (1.88 m)   Wt 155 lb (70.3 kg)   BMI 19.90 kg/m        Wt Readings from Last 3 Encounters:  06/12/24 155 lb (70.3 kg)  05/05/24 148 lb 12.8 oz (67.5 kg)  04/09/23 148 lb 12.8 oz (67.5 kg)    GEN: Well nourished, well developed in no acute distress NECK: No  JVD; No carotid bruits CARDIAC: RRR, no murmurs, rubs, gallops RESPIRATORY:  Clear to auscultation without rales, wheezing or rhonchi  ABDOMEN: Soft, non-tender, non-distended EXTREMITIES:  No edema; No deformity   ASSESSMENT AND PLAN  Aortic valve insufficiency Moderate insufficiency, asymptomatic, no intervention needed. - Continue annual echocardiograms to monitor function.  Heart failure with mildly reduced ejection fraction Ejection fraction 45-50%, asymptomatic, possible contribution from aortic valve insufficiency. - Monitor for symptoms such as dyspnea, peripheral edema, or decreased exercise tolerance. - Consider repeat echocardiogram if symptoms develop.  Essential hypertension Blood pressure well-controlled, occasional low readings, diastolic 91 mmHg during visit. - Monitor blood pressure at home, especially diastolic pressure. - Consider medication adjustment if diastolic pressure consistently exceeds 90 mmHg.  Peripheral edema Mild edema, more in left leg, no significant change. - Continue use of compression socks.  Hyperlipidemia LDL 98 mg/dL, above target due to calcified aortic valve, statin discontinued due to myalgia. - Noted lipid panel results for primary care provider to review. - Consider starting Zetia if primary care provider agrees.      Dispo: He can return in a year to see  Dr. Okey  Signed, Orren LOISE Fabry, PA-C   "

## 2024-06-12 NOTE — Patient Instructions (Signed)
 Medication Instructions:  Your physician recommends that you continue on your current medications as directed. Please refer to the Current Medication list given to you today. *If you need a refill on your cardiac medications before your next appointment, please call your pharmacy*  Lab Work: COMPLETE A LIPID PANEL WITH YOUR PCP If you have labs (blood work) drawn today and your tests are completely normal, you will receive your results only by: MyChart Message (if you have MyChart) OR A paper copy in the mail If you have any lab test that is abnormal or we need to change your treatment, we will call you to review the results.  Testing/Procedures: NONE ORDERED  Follow-Up: At [facility], you and your health needs are our priority.  As part of our continuing mission to provide you with exceptional heart care, our providers are all part of one team.  This team includes your primary Cardiologist (physician) and Advanced Practice Providers or APPs (Physician Assistants and Nurse Practitioners) who all work together to provide you with the care you need, when you need it.  Your next appointment:   6 month(s)  Provider:   Vina Gull, MD    We recommend signing up for the patient portal called MyChart.  Sign up information is provided on this After Visit Summary.  MyChart is used to connect with patients for Virtual Visits (Telemedicine).  Patients are able to view lab/test results, encounter notes, upcoming appointments, etc.  Non-urgent messages can be sent to your provider as well.   To learn more about what you can do with MyChart, go to [email or web address].   Other Instructions KEEP AN EYE ON YOUR BLOOD PRESSURE

## 2024-08-11 ENCOUNTER — Ambulatory Visit: Admitting: Podiatry
